# Patient Record
Sex: Male | Born: 1960
Health system: Southern US, Community
[De-identification: ages and names within clinical notes are randomized; demographics above are authoritative.]

## PROBLEM LIST (undated history)

## (undated) DIAGNOSIS — B029 Zoster without complications: Secondary | ICD-10-CM

## (undated) DIAGNOSIS — J45909 Unspecified asthma, uncomplicated: Secondary | ICD-10-CM

## (undated) HISTORY — PX: NO PAST SURGERIES: SHX2092

---

## 1998-11-23 ENCOUNTER — Ambulatory Visit (HOSPITAL_BASED_OUTPATIENT_CLINIC_OR_DEPARTMENT_OTHER): Admission: RE | Admit: 1998-11-23 | Discharge: 1998-11-23 | Payer: Self-pay | Admitting: Orthopedic Surgery

## 2002-11-17 ENCOUNTER — Encounter: Payer: Self-pay | Admitting: Emergency Medicine

## 2002-11-17 ENCOUNTER — Inpatient Hospital Stay (HOSPITAL_COMMUNITY): Admission: EM | Admit: 2002-11-17 | Discharge: 2002-11-18 | Payer: Self-pay | Admitting: Emergency Medicine

## 2012-03-03 ENCOUNTER — Other Ambulatory Visit: Payer: Self-pay | Admitting: Occupational Medicine

## 2012-03-03 ENCOUNTER — Ambulatory Visit: Payer: Self-pay

## 2012-03-03 DIAGNOSIS — Z0000 Encounter for general adult medical examination without abnormal findings: Secondary | ICD-10-CM

## 2018-11-09 ENCOUNTER — Encounter (HOSPITAL_COMMUNITY): Payer: Self-pay

## 2018-11-09 ENCOUNTER — Emergency Department (HOSPITAL_COMMUNITY)
Admission: EM | Admit: 2018-11-09 | Discharge: 2018-11-09 | Disposition: A | Discharge status: Home / Self Care | Payer: Self-pay | Attending: Emergency Medicine | Admitting: Emergency Medicine

## 2018-11-09 ENCOUNTER — Emergency Department (HOSPITAL_COMMUNITY): Payer: Self-pay

## 2018-11-09 DIAGNOSIS — F17210 Nicotine dependence, cigarettes, uncomplicated: Secondary | ICD-10-CM | POA: Insufficient documentation

## 2018-11-09 DIAGNOSIS — B029 Zoster without complications: Secondary | ICD-10-CM | POA: Insufficient documentation

## 2018-11-09 DIAGNOSIS — Z79899 Other long term (current) drug therapy: Secondary | ICD-10-CM | POA: Insufficient documentation

## 2018-11-09 DIAGNOSIS — M549 Dorsalgia, unspecified: Secondary | ICD-10-CM | POA: Insufficient documentation

## 2018-11-09 HISTORY — DX: Unspecified asthma, uncomplicated: J45.909

## 2018-11-09 MED ORDER — OXYCODONE-ACETAMINOPHEN 5-325 MG PO TABS: 1.0000 | ORAL_TABLET | ORAL | 0 refills | Status: DC | PRN

## 2018-11-09 MED ORDER — VALACYCLOVIR HCL 1 G PO TABS: 1000.0000 mg | ORAL_TABLET | Freq: Three times a day (TID) | ORAL | 0 refills | Status: AC

## 2018-11-09 NOTE — ED Notes (Signed)
Patient verbalizes understanding of discharge instructions. Opportunity for questioning and answers were provided. Armband removed by staff, pt discharged from ED ambulatory.   

## 2018-11-09 NOTE — Discharge Instructions (Signed)
You were seen in the emergency department today for back pain.  Your x-rays did not show any fractures or dislocations, there were some degenerative changes.  We suspect you have shingles.  We are sending you home with Valtrex, antiviral medicine, to treat this.  We also send you home with Percocet to treat pain.  -Percocet-this is a narcotic/controlled substance medication that has potential addicting qualities.  We recommend that you take 1-2 tablets every 6 hours as needed for severe pain.  Do not drive or operate heavy machinery when taking this medicine as it can be sedating. Do not drink alcohol or take other sedating medications when taking this medicine for safety reasons.  Keep this out of reach of small children.  Please be aware this medicine has Tylenol in it (325 mg/tab) do not exceed the maximum dose of Tylenol in a day per over the counter recommendations should you decide to supplement with Tylenol over the counter.   We have prescribed you new medication(s) today. Discuss the medications prescribed today with your pharmacist as they can have adverse effects and interactions with your other medicines including over the counter and prescribed medications. Seek medical evaluation if you start to experience new or abnormal symptoms after taking one of these medicines, seek care immediately if you start to experience difficulty breathing, feeling of your throat closing, facial swelling, or rash as these could be indications of a more serious allergic reaction  Please follow-up with primary care within 1 week for reevaluation.  Please be sure to keep the rash area covered when out in public and be sure to wash your hands plenty to prevent spread of infection.  May use calamine lotion or cool compresses to help with discomfort of the rash.  Return to the ER for new or worsening symptoms or any other concerns.

## 2018-11-09 NOTE — ED Triage Notes (Signed)
Pt endorses falling last week and hurting his back. Self medicated with naproxen on Thursday and on Friday developed a rash on this back and stomach. Denies shob or throat closure. Breath sounds clear. VSS

## 2018-11-09 NOTE — ED Provider Notes (Signed)
[facility] EMERGENCY DEPARTMENT Provider Note   CSN: [id number] Arrival date & time: 11/09/18  1322     History   Chief Complaint Chief Complaint  Patient presents with  . Allergic Reaction  . Rash    HPI Dylan Wood is a 57 y.o. male with history of tobacco abuse and asthma who presents to the emergency department with back pain status post mechanical fall 1 week prior as well as a rash that is painful and has been present for the past 4 days.  Patient states that he was ambulating tripped/slipped and fell landing on the back.  No head injury or loss of consciousness.  He states that since the fall he has been having pain to the diffuse lower and mid back.  States this is moderate to severe in nature, worse with movement, no significant alleviating factors.  He has been taking naproxen without relief.  He states that over the past 4 days he developed a painful/burning rash to the left side of his back that wraps around to the left side of the abdomen-states this is further irritated his initial back pain.  No history of similar rash.  He is taking naproxen previously without issues.  Denies dyspnea, facial swelling, difficulty swallowing, numbness, weakness, incontinence, saddle anesthesia, fever, chills, IVDU, or history of cancer.  No prior back surgeries.  HPI  Past Medical History:  Diagnosis Date  . Asthma     There are no active problems to display for this patient.   History reviewed. No pertinent surgical history.      Home Medications    Prior to Admission medications   Medication Sig Start Date End Date Taking? Authorizing Provider  acetaminophen (TYLENOL) 650 MG CR tablet Take 650-1,300 mg by mouth every 8 (eight) hours as needed for pain.   Yes [provider]  albuterol (VENTOLIN HFA) 108 (90 Base) MCG/ACT inhaler Inhale 2 puffs into the lungs every 6 (six) hours as needed for wheezing or shortness of breath.   Yes [provider]  naproxen sodium (ALEVE) 220 MG tablet Take 220-440 mg by mouth 2 (two) times daily as needed (for pain).   Yes [provider]    Family History History reviewed. No pertinent family history.  Social History Social History   Tobacco Use  . Smoking status: Current Every Day Smoker    Packs/day: 1.00    Types: Cigarettes  Substance Use Topics  . Alcohol use: Yes    Comment: occ  . Drug use: Never     Allergies   Naproxen   Review of Systems Review of Systems  Constitutional: Negative for chills, fever and unexpected weight change.  HENT: Negative for facial swelling, sore throat and trouble swallowing.   Respiratory: Negative for shortness of breath.   Cardiovascular: Negative for chest pain.  Gastrointestinal: Negative for abdominal pain, nausea and vomiting.  Genitourinary: Negative for dysuria.  Musculoskeletal: Positive for back pain.  Skin: Positive for rash.  Neurological: Negative for weakness and numbness.       Negative for saddle anesthesia or bowel/bladder incontinence.   All other systems reviewed and are negative.    Physical Exam Updated Vital Signs BP (!) 145/81 (BP Location: Right Arm)   Pulse 78   Temp 98 F (36.7 C) (Oral)   Resp 16   Ht 6\' 1"  (1.854 m)   Wt 81.6 kg   SpO2 100%   BMI 23.75 kg/m   Physical Exam Vitals  signs and nursing note reviewed.  Constitutional:      General: He is not in acute distress.    Appearance: He is well-developed. He is not toxic-appearing.  HENT:     Head: Normocephalic and atraumatic. No raccoon eyes or Battle's sign.  Eyes:     General:        Right eye: No discharge.        Left eye: No discharge.     Conjunctiva/sclera: Conjunctivae normal.  Neck:     Musculoskeletal: Normal range of motion and neck supple. No spinous process tenderness or muscular tenderness.  Cardiovascular:     Rate and Rhythm: Normal rate and regular rhythm.  Pulmonary:     Effort: Pulmonary effort  is normal. No respiratory distress.     Breath sounds: Normal breath sounds. No wheezing, rhonchi or rales.  Abdominal:     General: There is no distension.     Palpations: Abdomen is soft.     Tenderness: There is no guarding or rebound.  Musculoskeletal:     Comments: Patient has a vesicular rash with some lesions that are encrusted to the L flank/abdomen area in a dermatomal pattern- does not cross midline, tender to palpation. No significant warmth. No palpable fluctuance.  Extremities: Normal ROM. Nontender.  Back: Patient diffusely tender to thoracic/lumbar area including midline & bilateral paraspinal muscles L>R. No point/focal vertebral tenderness, no palpable step off or crepitus.   Skin:    General: Skin is warm and dry.     Findings: No rash.  Neurological:     Mental Status: He is alert.     Deep Tendon Reflexes:     Reflex Scores:      Patellar reflexes are 2+ on the right side and 2+ on the left side.    Comments: Clear speech.  Sensation grossly intact bilateral lower extremities.  5/5 strength with ankle plantar/dorsiflexion bilaterally.  Patellar DTRs are 2+ and symmetric.  Patient is ambulatory.  Psychiatric:        Behavior: Behavior normal.    ED Treatments / Results  Labs (all labs ordered are listed, but only abnormal results are displayed) Labs Reviewed - No data to display  EKG None  Radiology Dg Thoracic Spine 2 View  Result Date: 11/09/2018 CLINICAL DATA:  Larey SeatFell last week, pain in the mid back EXAM: THORACIC SPINE 2 VIEWS COMPARISON:  Chest x-ray of 03/03/2012 FINDINGS: The thoracic vertebrae are in normal alignment. No compression deformity is seen. Intervertebral disc spaces appear normal. No prominent paravertebral soft tissue is noted. There are degenerative changes present at C5-6 and C6-7 levels. IMPRESSION: 1. Normal alignment with no acute abnormality. 2. Degenerative change at C5-6 and C6-7 Electronically Signed   By: Dwyane DeePaul  Barry M.D.   On:  11/09/2018 15:50   Dg Lumbar Spine Complete  Result Date: 11/09/2018 CLINICAL DATA:  Fall last week, pain to mid back. Self medicated with naproxen on Thursday and on Friday developed a rash on this back and stomach EXAM: LUMBAR SPINE - COMPLETE 4+ VIEW COMPARISON:  None. FINDINGS: There is no evidence of lumbar spine fracture. Alignment is normal. Intervertebral disc spaces are maintained. Facet DJD L4-5, left worse than right. Aortic Atherosclerosis (ICD10-170.0). IMPRESSION: 1. Negative for fracture or other acute bone abnormality. 2. Facet DJD L4-5. Electronically Signed   By: Corlis Leak  Hassell M.D.   On: 11/09/2018 15:50    Procedures Procedures (including critical care time)  Medications Ordered in ED Medications - No data to  display   Initial Impression / Assessment and Plan / ED Course  I have reviewed the triage vital signs and the nursing notes.  Pertinent labs & imaging results that were available during my care of the patient were reviewed by me and considered in my medical decision making (see chart for details).   Patient presents to the ED with back pain s/p mechanical fall and painful rash to the L side. Patient nontoxic appearing, does not appear to have serious head/neck/back injury s/p fall. No CT head/cspine per [place] trauma rule. Thoracic/lumbar plain films without acute injury, degenerative changes noted. No point/focal midline tenderness or palpable step off. No neuro deficits. Suspect initial back pain was more so due to muscle soreness. Considered disc disease, UTI/pyelonephritis, kidney stone, aortic aneurysm/dissection, cauda equina or epidural abscess however these do not fit clinical picture at this time. Rash is tender with vesicles on erythematous base located along dermatomal pattern in the thoracic region unilaterally consistent with shingles. Does not appear to have CNS involvement. Does not appear to have superimposed bacterial infection. Will discharge home with  Valacyclovir for shingles & percocet for pain control, [place] Controlled Substance reporting System queried. PCP follow up. I discussed results, treatment plan, need for follow-up, and return precautions with the patient. Provided opportunity for questions, patient confirmed understanding and is in agreement with plan.   Final Clinical Impressions(s) / ED Diagnoses   Final diagnoses:  Acute bilateral back pain, unspecified back location  Herpes zoster without complication    ED Discharge Orders         Ordered    valACYclovir (VALTREX) 1000 MG tablet  3 times daily     11/09/18 1611    oxyCODONE-acetaminophen (PERCOCET/ROXICET) 5-325 MG tablet  Every 4 hours PRN     11/09/18 1611           Mirra Basilio, Pleas Koch, PA-C 11/09/18 1613    Gerhard Munch, MD 11/11/18 2354

## 2018-11-09 NOTE — ED Notes (Signed)
Patient transported to X-ray 

## 2018-11-12 ENCOUNTER — Encounter (HOSPITAL_COMMUNITY): Payer: Self-pay

## 2018-11-12 ENCOUNTER — Emergency Department (HOSPITAL_COMMUNITY)
Admission: EM | Admit: 2018-11-12 | Discharge: 2018-11-12 | Disposition: A | Discharge status: Home / Self Care | Payer: Self-pay | Attending: Emergency Medicine | Admitting: Emergency Medicine

## 2018-11-12 DIAGNOSIS — Z79899 Other long term (current) drug therapy: Secondary | ICD-10-CM | POA: Insufficient documentation

## 2018-11-12 DIAGNOSIS — B029 Zoster without complications: Secondary | ICD-10-CM | POA: Insufficient documentation

## 2018-11-12 DIAGNOSIS — J45909 Unspecified asthma, uncomplicated: Secondary | ICD-10-CM | POA: Insufficient documentation

## 2018-11-12 DIAGNOSIS — F17210 Nicotine dependence, cigarettes, uncomplicated: Secondary | ICD-10-CM | POA: Insufficient documentation

## 2018-11-12 DIAGNOSIS — M545 Low back pain, unspecified: Secondary | ICD-10-CM

## 2018-11-12 MED ORDER — KETOROLAC TROMETHAMINE 30 MG/ML IJ SOLN
30.0000 mg | Freq: Once | INTRAMUSCULAR | Status: AC
  Administered 2018-11-12: 30 mg via INTRAMUSCULAR
  Filled 2018-11-12: qty 1

## 2018-11-12 MED ORDER — HYDROCODONE-ACETAMINOPHEN 5-325 MG PO TABS: 1.0000 | ORAL_TABLET | ORAL | 0 refills | Status: DC | PRN

## 2018-11-12 MED ORDER — DOCUSATE SODIUM 250 MG PO CAPS: 250.0000 mg | ORAL_CAPSULE | Freq: Every day | ORAL | 0 refills | Status: DC | PRN

## 2018-11-12 MED ORDER — FAMOTIDINE 20 MG PO TABS
20.0000 mg | ORAL_TABLET | Freq: Once | ORAL | Status: AC
  Administered 2018-11-12: 20 mg via ORAL
  Filled 2018-11-12: qty 1

## 2018-11-12 MED ORDER — CYCLOBENZAPRINE HCL 10 MG PO TABS: 10.0000 mg | ORAL_TABLET | Freq: Every day | ORAL | 0 refills | Status: DC

## 2018-11-12 NOTE — ED Triage Notes (Signed)
Pt with c/o L sided flank pain; Pt states that he recently dx with shingles w rash to LL abd area spreading to back

## 2018-11-12 NOTE — ED Provider Notes (Signed)
[facility] EMERGENCY DEPARTMENT Provider Note   CSN: [id number] Arrival date & time: 11/12/18  0536     History   Chief Complaint Chief Complaint  Patient presents with  . Flank Pain    HPI Dylan Wood is a 57 y.o. male with a history of asthma, tobacco abuse presenting to the ED today with low back and left flank pain for 2 weeks after a mechanical fall. He was seen here 3 days ago with a rash that is a new shingles diagnosis. He was started on valtrex and given percocet for the pain. His plain films were negative.  Today he is reporting continued back pain on the left. It is alleviated with the percocet but relief does not last for more than 2 hours. He has not been able to sleep because of the pain. The pain is localized to left flank and does not radiate.   He also reports a feeling of nerve pain over the rash that remains unchanged from his last visit. He has been talking the valtrex as prescribed.   He admits to constipation for 1 week.  Denies fever, chills, IVDU, weight changes, numbness, weakness, incontinence, saddle anesthesia, dyspnea, and history of cancer.       Past Medical History:  Diagnosis Date  . Asthma     There are no active problems to display for this patient.   History reviewed. No pertinent surgical history.      Home Medications    Prior to Admission medications   Medication Sig Start Date End Date Taking? Authorizing Provider  acetaminophen (TYLENOL) 650 MG CR tablet Take 650-1,300 mg by mouth every 8 (eight) hours as needed for pain.   Yes [provider]  albuterol (VENTOLIN HFA) 108 (90 Base) MCG/ACT inhaler Inhale 2 puffs into the lungs every 6 (six) hours as needed for wheezing or shortness of breath.   Yes [provider]  naproxen sodium (ALEVE) 220 MG tablet Take 220-440 mg by mouth 2 (two) times daily as needed (for pain).   Yes [provider]  valACYclovir (VALTREX) 1000 MG  tablet Take 1 tablet (1,000 mg total) by mouth 3 (three) times daily for 7 days. 11/09/18 11/16/18 Yes Petrucelli, Samantha R, PA-C  cyclobenzaprine (FLEXERIL) 10 MG tablet Take 1 tablet (10 mg total) by mouth at bedtime. 11/12/18   Alessia Gonsalez E, PA-C  docusate sodium (COLACE) 250 MG capsule Take 1 capsule (250 mg total) by mouth daily as needed for constipation (while taking narcotic pain medication). 11/12/18   Torrin Crihfield E, PA-C  HYDROcodone-acetaminophen (NORCO/VICODIN) 5-325 MG tablet Take 1 tablet by mouth every 4 (four) hours as needed. 11/12/18   Kathreen Dileo, Caroleen Hamman, PA-C    Family History History reviewed. No pertinent family history.  Social History Social History   Tobacco Use  . Smoking status: Current Every Day Smoker    Packs/day: 1.00    Types: Cigarettes  Substance Use Topics  . Alcohol use: Yes    Comment: occ  . Drug use: Never     Allergies   Naproxen   Review of Systems Review of Systems  Constitutional: Negative for activity change, chills, fever and unexpected weight change.  Respiratory: Negative for shortness of breath.   Cardiovascular: Negative for chest pain and palpitations.  Gastrointestinal: Negative for nausea.  Genitourinary: Positive for flank pain. Negative for difficulty urinating and dysuria.  Musculoskeletal: Positive for back pain. Negative for gait problem.  Pain is over left paraspinal muscles  Skin: Positive for rash.  Neurological: Positive for weakness and numbness.  All other systems reviewed and are negative.    Physical Exam Updated Vital Signs BP (!) 168/137 (BP Location: Right Arm)   Pulse 75   Temp 97.8 F (36.6 C) (Oral)   Resp 20   SpO2 100%   Physical Exam Constitutional:      Appearance: Normal appearance.  HENT:     Head: Normocephalic and atraumatic.     Nose: Nose normal.     Mouth/Throat:     Mouth: Mucous membranes are moist.     Pharynx: Oropharynx is clear.  Eyes:      Extraocular Movements: Extraocular movements intact.     Pupils: Pupils are equal, round, and reactive to light.  Neck:     Musculoskeletal: Normal range of motion and neck supple.  Cardiovascular:     Rate and Rhythm: Normal rate and regular rhythm.     Heart sounds: No murmur.  Pulmonary:     Effort: Pulmonary effort is normal.     Breath sounds: Normal breath sounds.  Abdominal:     Palpations: Abdomen is soft.     Tenderness: There is no abdominal tenderness.  Musculoskeletal: Normal range of motion.        General: Tenderness present. No swelling.     Comments: Point tenderness over rash  Skin:    General: Skin is warm and dry.     Findings: Rash present.     Comments: Crusted vesicles over L paraspinal muscles in dermatomal pattern. Does not cross midline, tender to palpation. No significant warmth or fluctuance   Neurological:     General: No focal deficit present.     Mental Status: He is alert. Mental status is at baseline.     Cranial Nerves: No cranial nerve deficit.     Sensory: No sensory deficit.     Motor: No weakness.     Coordination: Coordination normal.     Gait: Gait normal.     Deep Tendon Reflexes: Reflexes normal.  Psychiatric:        Mood and Affect: Mood normal.        Behavior: Behavior normal.        Thought Content: Thought content normal.        Judgment: Judgment normal.      ED Treatments / Results  Labs (all labs ordered are listed, but only abnormal results are displayed) Labs Reviewed - No data to display  EKG None  Radiology No results found.  Procedures Procedures (including critical care time)  Medications Ordered in ED Medications  ketorolac (TORADOL) 30 MG/ML injection 30 mg (30 mg Intramuscular Given 11/12/18 0730)  famotidine (PEPCID) tablet 20 mg (20 mg Oral Given 11/12/18 0730)     Initial Impression / Assessment and Plan / ED Course  I have reviewed the triage vital signs and the nursing notes.  Pertinent labs &  imaging results that were available during my care of the patient were reviewed by me and considered in my medical decision making (see chart for details).      Patient presents to the ED with continued back and rash He initially presented to the ED 3 days ago for a rash on left flank and was found to have new shingles diagnosis. His previous workup included negative thoracic/lumbar plain films.  Today he is reporting continued pain over the rash and that he is unable to sleep  because of it. The rash has encrusted vesicles following the dermatomal pattern consistent with the shingles diagnosis. His pain is likely neuropathic from shingles because of the location and his description of burning sensation. He will continue the valtrex and discussed follow up with pcp.  Back pain treatment was also discussed and the use of anti-inflammatories is encouraged to help with his acute pain. His back pain does not have red flags such as weight loss, IVDU, incontinence, history of cancer, and fever. Will discharge home with limited Vicodin for pain control over the next few days and flexeril prn to help him rest.   Final Clinical Impressions(s) / ED Diagnoses   Final diagnoses:  Acute left-sided low back pain without sciatica  Herpes zoster without complication    ED Discharge Orders         Ordered    HYDROcodone-acetaminophen (NORCO/VICODIN) 5-325 MG tablet  Every 4 hours PRN     11/12/18 0742    docusate sodium (COLACE) 250 MG capsule  Daily PRN     11/12/18 0746    cyclobenzaprine (FLEXERIL) 10 MG tablet  Daily at bedtime     11/12/18 [id number]           Sherene Sireslbrizze, Wendelin Bradt E, PA-C 11/12/18 1229    Dione BoozeGlick, David, MD 11/13/18 0710

## 2018-11-12 NOTE — ED Notes (Signed)
Pt discharged from ED; instructions provided and scripts given; Pt encouraged to return to ED if symptoms worsen and to f/u with PCP; Pt verbalized understanding of all instructions 

## 2018-11-12 NOTE — Discharge Instructions (Addendum)
Follow up with PCP for shingles management.

## 2018-11-12 NOTE — ED Provider Notes (Signed)
7:11 AM Patient seen in conjunction with Albrizze PA-C. Pt with continued pain in back after fall last week and subsequent development of shingles rash.  Plain films were negative.  His pain is at the site of shingles on L back and flank.  Rash is classic appearance and pattern for shingles.   Will treat pain here with IM toradol. Home with additional pain meds and muscle relaxant to take at bedtime. Will also give PCP referrals.   BP (!) 168/137 (BP Location: Right Arm)   Pulse 75   Temp 97.8 F (36.6 C) (Oral)   Resp 20   SpO2 100%     Renne CriglerGeiple, Damoni Causby, PA-C 11/12/18 0848    Alvira MondaySchlossman, Erin, MD 11/12/18 [phone]

## 2018-11-14 ENCOUNTER — Encounter (HOSPITAL_COMMUNITY): Payer: Self-pay | Admitting: Emergency Medicine

## 2018-11-14 ENCOUNTER — Other Ambulatory Visit: Payer: Self-pay

## 2018-11-14 ENCOUNTER — Emergency Department (HOSPITAL_COMMUNITY)
Admission: EM | Admit: 2018-11-14 | Discharge: 2018-11-14 | Disposition: A | Discharge status: Home / Self Care | Payer: Self-pay | Attending: Emergency Medicine | Admitting: Emergency Medicine

## 2018-11-14 DIAGNOSIS — J45909 Unspecified asthma, uncomplicated: Secondary | ICD-10-CM | POA: Insufficient documentation

## 2018-11-14 DIAGNOSIS — F17210 Nicotine dependence, cigarettes, uncomplicated: Secondary | ICD-10-CM | POA: Insufficient documentation

## 2018-11-14 DIAGNOSIS — B028 Zoster with other complications: Secondary | ICD-10-CM

## 2018-11-14 DIAGNOSIS — B029 Zoster without complications: Secondary | ICD-10-CM | POA: Insufficient documentation

## 2018-11-14 MED ORDER — MORPHINE SULFATE 15 MG PO TABS: 15.0000 mg | ORAL_TABLET | ORAL | 0 refills | Status: DC | PRN

## 2018-11-14 MED ORDER — KETOROLAC TROMETHAMINE 60 MG/2ML IM SOLN
15.0000 mg | Freq: Once | INTRAMUSCULAR | Status: AC
  Administered 2018-11-14: 15 mg via INTRAMUSCULAR
  Filled 2018-11-14: qty 2

## 2018-11-14 MED ORDER — OXYCODONE HCL 5 MG PO TABS
10.0000 mg | ORAL_TABLET | Freq: Once | ORAL | Status: AC
  Administered 2018-11-14: 10 mg via ORAL
  Filled 2018-11-14: qty 2

## 2018-11-14 MED ORDER — DIAZEPAM 5 MG PO TABS
5.0000 mg | ORAL_TABLET | Freq: Once | ORAL | Status: AC
  Administered 2018-11-14: 5 mg via ORAL
  Filled 2018-11-14: qty 1

## 2018-11-14 MED ORDER — ACETAMINOPHEN 500 MG PO TABS
1000.0000 mg | ORAL_TABLET | Freq: Once | ORAL | Status: DC
  Filled 2018-11-14: qty 2

## 2018-11-14 MED ORDER — ONDANSETRON 4 MG PO TBDP: ORAL_TABLET | ORAL | 0 refills | Status: DC

## 2018-11-14 NOTE — ED Triage Notes (Signed)
Pt c/o pain related to shingles. Recent diagnosis this week, has been taking valtrex and hydrocodone, pt reports that he ran out of hydrocodone, received a new rx but hasn't gotten med filled yet.

## 2018-11-14 NOTE — ED Provider Notes (Signed)
[facility] EMERGENCY DEPARTMENT Provider Note   CSN: [id number] Arrival date & time: 11/14/18  0225     History   Chief Complaint Chief Complaint  Patient presents with  . Herpes Zoster    HPI Dylan Wood is a 57 y.o. male.  57 yo M with a chief complaint of left-sided thoracic back pain.  Patient has been seen here now 3 times for the same.  Diagnosed with shingles.  He felt that the pain is gotten somewhat worse over the past couple days.  Denies further distribution of the rash.  Denies fevers or chills.  The history is provided by the patient.  Illness  This is a new problem. The current episode started more than 1 week ago. The problem occurs constantly. The problem has not changed since onset.Pertinent negatives include no chest pain, no abdominal pain, no headaches and no shortness of breath. Nothing aggravates the symptoms. Nothing relieves the symptoms. He has tried nothing for the symptoms. The treatment provided no relief.    Past Medical History:  Diagnosis Date  . Asthma     There are no active problems to display for this patient.   History reviewed. No pertinent surgical history.      Home Medications    Prior to Admission medications   Medication Sig Start Date End Date Taking? Authorizing Provider  acetaminophen (TYLENOL) 650 MG CR tablet Take 650-1,300 mg by mouth every 8 (eight) hours as needed for pain.    [provider]  albuterol (VENTOLIN HFA) 108 (90 Base) MCG/ACT inhaler Inhale 2 puffs into the lungs every 6 (six) hours as needed for wheezing or shortness of breath.    [provider]  cyclobenzaprine (FLEXERIL) 10 MG tablet Take 1 tablet (10 mg total) by mouth at bedtime. 11/12/18   Albrizze, Kaitlyn E, PA-C  docusate sodium (COLACE) 250 MG capsule Take 1 capsule (250 mg total) by mouth daily as needed for constipation (while taking narcotic pain medication). 11/12/18   Albrizze, Kaitlyn E, PA-C    HYDROcodone-acetaminophen (NORCO/VICODIN) 5-325 MG tablet Take 1 tablet by mouth every 4 (four) hours as needed. 11/12/18   Albrizze, Kaitlyn E, PA-C  morphine (MSIR) 15 MG tablet Take 1 tablet (15 mg total) by mouth every 4 (four) hours as needed for severe pain. 11/14/18   Melene Plan, DO  naproxen sodium (ALEVE) 220 MG tablet Take 220-440 mg by mouth 2 (two) times daily as needed (for pain).    [provider]  ondansetron (ZOFRAN ODT) 4 MG disintegrating tablet 4mg  ODT q4 hours prn nausea/vomit 11/14/18   Melene Plan, DO  valACYclovir (VALTREX) 1000 MG tablet Take 1 tablet (1,000 mg total) by mouth 3 (three) times daily for 7 days. 11/09/18 11/16/18  Petrucelli, Pleas Koch, PA-C    Family History No family history on file.  Social History Social History   Tobacco Use  . Smoking status: Current Every Day Smoker    Packs/day: 1.00    Types: Cigarettes  . Smokeless tobacco: Never Used  Substance Use Topics  . Alcohol use: Yes    Comment: occ  . Drug use: Never     Allergies   Naproxen   Review of Systems Review of Systems  Constitutional: Negative for chills and fever.  HENT: Negative for congestion and facial swelling.   Eyes: Negative for discharge and visual disturbance.  Respiratory: Negative for shortness of breath.   Cardiovascular: Negative for chest pain and palpitations.  Gastrointestinal: Negative  for abdominal pain, diarrhea and vomiting.  Musculoskeletal: Positive for back pain. Negative for arthralgias and myalgias.  Skin: Positive for rash. Negative for color change.  Neurological: Negative for tremors, syncope and headaches.  Psychiatric/Behavioral: Negative for confusion and dysphoric mood.     Physical Exam Updated Vital Signs BP 117/79   Pulse 76   Temp 97.6 F (36.4 C) (Oral)   Resp 16   SpO2 100%   Physical Exam Vitals signs and nursing note reviewed.  Constitutional:      Appearance: He is well-developed.  HENT:     Head:  Normocephalic and atraumatic.  Eyes:     Pupils: Pupils are equal, round, and reactive to light.  Neck:     Musculoskeletal: Normal range of motion and neck supple.     Vascular: No JVD.  Cardiovascular:     Rate and Rhythm: Normal rate and regular rhythm.     Heart sounds: No murmur. No friction rub. No gallop.   Pulmonary:     Effort: No respiratory distress.     Breath sounds: No wheezing.  Abdominal:     General: There is no distension.     Tenderness: There is no guarding or rebound.  Musculoskeletal: Normal range of motion.     Comments: Vesicular rash to the left back that wraps around to his abdomen.  About ribs 5 through 8  Skin:    Coloration: Skin is not pale.     Findings: No rash.  Neurological:     Mental Status: He is alert and oriented to person, place, and time.  Psychiatric:        Behavior: Behavior normal.      ED Treatments / Results  Labs (all labs ordered are listed, but only abnormal results are displayed) Labs Reviewed - No data to display  EKG None  Radiology No results found.  Procedures Procedures (including critical care time)  Medications Ordered in ED Medications  acetaminophen (TYLENOL) tablet 1,000 mg (1,000 mg Oral Refused 11/14/18 0353)  oxyCODONE (Oxy IR/ROXICODONE) immediate release tablet 10 mg (10 mg Oral Given 11/14/18 0353)  ketorolac (TORADOL) injection 15 mg (15 mg Intramuscular Given 11/14/18 0353)  diazepam (VALIUM) tablet 5 mg (5 mg Oral Given 11/14/18 0353)     Initial Impression / Assessment and Plan / ED Course  I have reviewed the triage vital signs and the nursing notes.  Pertinent labs & imaging results that were available during my care of the patient were reviewed by me and considered in my medical decision making (see chart for details).     57 yo M with a chief complaint of left thoracic back pain.  Patient has been having difficulty sleeping due to the pain.  I will attempt to change his pain management  regimen.  He had finished his Percocets, I will start him on oral morphine we will have him do Tylenol and ibuprofen prior to narcotics suggested that this prolonged course of pain and requiring multiple visits to the ED he likely needs to follow-up with his family doctor for further pain management.  4:06 AM:  I have discussed the diagnosis/risks/treatment options with the patient and believe the pt to be eligible for discharge home to follow-up with PCP. We also discussed returning to the ED immediately if new or worsening sx occur. We discussed the sx which are most concerning (e.g., sudden worsening pain, fever, inability to tolerate by mouth) that necessitate immediate return. Medications administered to the patient during their  visit and any new prescriptions provided to the patient are listed below.  Medications given during this visit Medications  acetaminophen (TYLENOL) tablet 1,000 mg (1,000 mg Oral Refused 11/14/18 0353)  oxyCODONE (Oxy IR/ROXICODONE) immediate release tablet 10 mg (10 mg Oral Given 11/14/18 0353)  ketorolac (TORADOL) injection 15 mg (15 mg Intramuscular Given 11/14/18 0353)  diazepam (VALIUM) tablet 5 mg (5 mg Oral Given 11/14/18 0353)      The patient appears reasonably screen and/or stabilized for discharge and I doubt any other medical condition or other [facility] requiring further screening, evaluation, or treatment in the ED at this time prior to discharge.    Final Clinical Impressions(s) / ED Diagnoses   Final diagnoses:  Herpes zoster with complication    ED Discharge Orders         Ordered    morphine (MSIR) 15 MG tablet  Every 4 hours PRN     11/14/18 0351    ondansetron (ZOFRAN ODT) 4 MG disintegrating tablet     11/14/18 0351           Melene PlanFloyd, Janaisha Tolsma, DO 11/14/18 0406

## 2018-11-14 NOTE — ED Notes (Signed)
Pt verbalizes understanding of d/c instructions. Prescriptions reviewed with patient. Pt ambulatory at d/c with all belongings. Pt has a ride home.

## 2018-11-14 NOTE — Discharge Instructions (Signed)

## 2018-11-19 ENCOUNTER — Encounter (HOSPITAL_COMMUNITY): Payer: Self-pay | Admitting: Emergency Medicine

## 2018-11-19 ENCOUNTER — Other Ambulatory Visit: Payer: Self-pay

## 2018-11-19 ENCOUNTER — Emergency Department (HOSPITAL_COMMUNITY)
Admission: EM | Admit: 2018-11-19 | Discharge: 2018-11-19 | Disposition: A | Discharge status: Home / Self Care | Payer: Self-pay | Attending: Emergency Medicine | Admitting: Emergency Medicine

## 2018-11-19 DIAGNOSIS — J45909 Unspecified asthma, uncomplicated: Secondary | ICD-10-CM | POA: Insufficient documentation

## 2018-11-19 DIAGNOSIS — B028 Zoster with other complications: Secondary | ICD-10-CM | POA: Insufficient documentation

## 2018-11-19 DIAGNOSIS — F17210 Nicotine dependence, cigarettes, uncomplicated: Secondary | ICD-10-CM | POA: Insufficient documentation

## 2018-11-19 MED ORDER — GABAPENTIN 100 MG PO CAPS: 100.0000 mg | ORAL_CAPSULE | Freq: Three times a day (TID) | ORAL | 0 refills | Status: DC

## 2018-11-19 MED ORDER — DIAZEPAM 5 MG PO TABS
5.0000 mg | ORAL_TABLET | Freq: Once | ORAL | Status: DC
  Filled 2018-11-19: qty 1

## 2018-11-19 MED ORDER — KETOROLAC TROMETHAMINE 30 MG/ML IJ SOLN
15.0000 mg | Freq: Once | INTRAMUSCULAR | Status: AC
  Administered 2018-11-19: 15 mg via INTRAMUSCULAR
  Filled 2018-11-19: qty 1

## 2018-11-19 MED ORDER — MELOXICAM 7.5 MG PO TABS: 7.5000 mg | ORAL_TABLET | Freq: Every day | ORAL | 0 refills | Status: DC

## 2018-11-19 NOTE — ED Provider Notes (Signed)
[facility] Provider Note   CSN: [id number] Arrival date & time: 11/19/18  [id number]     History   Chief Complaint Chief Complaint  Patient presents with  . Breast Pain    everywhere    HPI Dylan Wood is a 57 y.o. male with history of asthma who presents with ongoing shingles pain.  Patient reports he has had symptoms for the past 3 weeks, but was not diagnosed until 11/09/2018.  Patient was started on Valtrex at that time.  He was also given Percocet.  Patient has been to the ED 3 times for this prior to today.  He has tried oral morphine and his pain continues.  He describes his pain as burning and tightening of his muscles.  He denies any fevers.  He reports his rash on his left flank is improving.  He denies any chest pain, shortness of breath, abdominal pain, nausea, vomiting.  HPI  Past Medical History:  Diagnosis Date  . Asthma     There are no active problems to display for this patient.   History reviewed. No pertinent surgical history.      Home Medications    Prior to Admission medications   Medication Sig Start Date End Date Taking? Authorizing Provider  acetaminophen (TYLENOL) 650 MG CR tablet Take 650-1,300 mg by mouth every 8 (eight) hours as needed for pain.    [provider]  albuterol (VENTOLIN HFA) 108 (90 Base) MCG/ACT inhaler Inhale 2 puffs into the lungs every 6 (six) hours as needed for wheezing or shortness of breath.    [provider]  cyclobenzaprine (FLEXERIL) 10 MG tablet Take 1 tablet (10 mg total) by mouth at bedtime. 11/12/18   Albrizze, Kaitlyn E, PA-C  docusate sodium (COLACE) 250 MG capsule Take 1 capsule (250 mg total) by mouth daily as needed for constipation (while taking narcotic pain medication). 11/12/18   Albrizze, Kaitlyn E, PA-C  gabapentin (NEURONTIN) 100 MG capsule Take 1 capsule (100 mg total) by mouth 3 (three) times daily. 11/19/18   Gianluca Chhim, Waylan BogaAlexandra M, PA-C    HYDROcodone-acetaminophen (NORCO/VICODIN) 5-325 MG tablet Take 1 tablet by mouth every 4 (four) hours as needed. 11/12/18   Albrizze, Kaitlyn E, PA-C  meloxicam (MOBIC) 7.5 MG tablet Take 1 tablet (7.5 mg total) by mouth daily. 11/19/18   Willford Rabideau, Waylan BogaAlexandra M, PA-C  morphine (MSIR) 15 MG tablet Take 1 tablet (15 mg total) by mouth every 4 (four) hours as needed for severe pain. 11/14/18   Melene PlanFloyd, Dan, DO  naproxen sodium (ALEVE) 220 MG tablet Take 220-440 mg by mouth 2 (two) times daily as needed (for pain).    [provider]  ondansetron (ZOFRAN ODT) 4 MG disintegrating tablet 4mg  ODT q4 hours prn nausea/vomit 11/14/18   Melene PlanFloyd, Dan, DO    Family History No family history on file.  Social History Social History   Tobacco Use  . Smoking status: Current Every Day Smoker    Packs/day: 1.00    Types: Cigarettes  . Smokeless tobacco: Never Used  Substance Use Topics  . Alcohol use: Yes    Comment: occ  . Drug use: Never     Allergies   Naproxen   Review of Systems Review of Systems  Constitutional: Negative for fever.  Musculoskeletal: Positive for back pain.  Skin: Positive for rash.     Physical Exam Updated Vital Signs BP (!) 165/88 (BP Location: Right Arm)   Pulse 73   Temp  97.7 F (36.5 C) (Oral)   Resp 18   SpO2 97%   Physical Exam Vitals signs and nursing note reviewed.  Constitutional:      General: He is not in acute distress.    Appearance: He is well-developed. He is not diaphoretic.  HENT:     Head: Normocephalic and atraumatic.     Mouth/Throat:     Pharynx: No oropharyngeal exudate.  Eyes:     General: No scleral icterus.       Right eye: No discharge.        Left eye: No discharge.     Conjunctiva/sclera: Conjunctivae normal.     Pupils: Pupils are equal, round, and reactive to light.  Neck:     Musculoskeletal: Normal range of motion and neck supple.     Thyroid: No thyromegaly.  Cardiovascular:     Rate and Rhythm: Normal rate and  regular rhythm.     Heart sounds: Normal heart sounds. No murmur. No friction rub. No gallop.   Pulmonary:     Effort: Pulmonary effort is normal. No respiratory distress.     Breath sounds: Normal breath sounds. No stridor. No wheezing or rales.  Chest:    Abdominal:     General: Bowel sounds are normal. There is no distension.     Palpations: Abdomen is soft.     Tenderness: There is no abdominal tenderness. There is no guarding or rebound.  Musculoskeletal:       Back:  Lymphadenopathy:     Cervical: No cervical adenopathy.  Skin:    General: Skin is warm and dry.     Coloration: Skin is not pale.     Findings: No rash.  Neurological:     Mental Status: He is alert.     Coordination: Coordination normal.      ED Treatments / Results  Labs (all labs ordered are listed, but only abnormal results are displayed) Labs Reviewed - No data to display  EKG None  Radiology No results found.  Procedures Procedures (including critical care time)  Medications Ordered in ED Medications  ketorolac (TORADOL) 30 MG/ML injection 15 mg (15 mg Intramuscular Given 11/19/18 0747)     Initial Impression / Assessment and Plan / ED Course  I have reviewed the triage vital signs and the nursing notes.  Pertinent labs & imaging results that were available during my care of the patient were reviewed by me and considered in my medical decision making (see chart for details).     Patient presenting with ongoing burning pain from herpes zoster.  The rash appears to be improving.  There are no signs of secondary infection.  Patient having difficulty controlling pain.  Patient reports he did have some relief from the Toradol he received at his last visit.  Patient given Toradol in the ED.  Will start Mobic and Neurontin.  Neurontin taper discussed with the patient.  Patient strongly encouraged to follow-up and establish care with a PCP and neurology.  Return precautions discussed.  Patient  understands and agrees with plan.  Patient vitals stable throughout ED course and discharged in satisfactory condition. I discussed patient case with Dr. Clayborne Dana who guided the patient's management and agrees with plan.   Final Clinical Impressions(s) / ED Diagnoses   Final diagnoses:  Herpes zoster with complication    ED Discharge Orders         Ordered    meloxicam (MOBIC) 7.5 MG tablet  Daily  11/19/18 0755    gabapentin (NEURONTIN) 100 MG capsule  3 times daily     11/19/18 0755           Emi HolesLaw, Yeudiel Mateo M, PA-C 11/19/18 [id number]    Marily MemosMesner, Jason, MD 11/19/18 [phone]

## 2018-11-19 NOTE — ED Triage Notes (Signed)
Pt. Stated, Dylan Wood had shingles since the 17th and this is my 4th visit and the pain is so bad, I can't stand it.

## 2018-11-19 NOTE — Discharge Instructions (Signed)
Take Mobic once daily for your pain.  Take Neurontin 3 times daily as prescribed.  If 100 mg 3 times a day is not helping after 4 to 5 days, you can increase to 200 mg 3 times a day.  If 200 mg 3 times a day is not helping after 4 to 5 days, you can increase to 300 mg 3 times a day.  It is important that you taper like this and do not immediately start taking 200-300 mg 3 times a day.  Please follow-up and establish care with a primary care provider by calling the number circled on your discharge paperwork.  Please also follow-up with neurology for further evaluation and treatment.  Please return the emergency department if you develop any new or worsening symptoms.

## 2019-01-09 ENCOUNTER — Emergency Department (HOSPITAL_COMMUNITY)
Admission: EM | Admit: 2019-01-09 | Discharge: 2019-01-09 | Disposition: A | Discharge status: Home / Self Care | Payer: Self-pay | Attending: Emergency Medicine | Admitting: Emergency Medicine

## 2019-01-09 ENCOUNTER — Encounter (HOSPITAL_COMMUNITY): Payer: Self-pay | Admitting: Emergency Medicine

## 2019-01-09 ENCOUNTER — Other Ambulatory Visit: Payer: Self-pay

## 2019-01-09 DIAGNOSIS — J45909 Unspecified asthma, uncomplicated: Secondary | ICD-10-CM | POA: Insufficient documentation

## 2019-01-09 DIAGNOSIS — Z79899 Other long term (current) drug therapy: Secondary | ICD-10-CM | POA: Insufficient documentation

## 2019-01-09 DIAGNOSIS — F17210 Nicotine dependence, cigarettes, uncomplicated: Secondary | ICD-10-CM | POA: Insufficient documentation

## 2019-01-09 DIAGNOSIS — B029 Zoster without complications: Secondary | ICD-10-CM | POA: Insufficient documentation

## 2019-01-09 MED ORDER — VALACYCLOVIR HCL 1 G PO TABS: 1000.0000 mg | ORAL_TABLET | Freq: Three times a day (TID) | ORAL | 0 refills | Status: AC

## 2019-01-09 MED ORDER — GABAPENTIN 300 MG PO CAPS: 300.0000 mg | ORAL_CAPSULE | Freq: Three times a day (TID) | ORAL | 0 refills | Status: DC

## 2019-01-09 MED ORDER — PREDNISONE 10 MG PO TABS: 20.0000 mg | ORAL_TABLET | Freq: Every day | ORAL | 0 refills | Status: AC

## 2019-01-09 NOTE — Discharge Instructions (Signed)
Follow-up with primary care doctor.  Return to ED if symptoms worsen.

## 2019-01-09 NOTE — ED Provider Notes (Signed)
[facility] EMERGENCY DEPARTMENT Provider Note   CSN: [id number] Arrival date & time: 01/09/19  1143     History   Chief Complaint Chief Complaint  Patient presents with  . Herpes Zoster    HPI Dylan Wood is a 58 y.o. male.  The history is provided by the patient.  Rash  Location:  Torso Torso rash location:  L flank Quality: painful   Pain details:    Quality:  Aching   Severity:  Mild   Onset quality:  Gradual   Timing:  Constant Onset quality:  Gradual Timing:  Constant Chronicity:  Recurrent Context comment:  Hx of zoster in same area Relieved by:  Nothing Worsened by:  Nothing Associated symptoms: no abdominal pain, no fever, no joint pain, no shortness of breath, no sore throat and not vomiting     Past Medical History:  Diagnosis Date  . Asthma     There are no active problems to display for this patient.   History reviewed. No pertinent surgical history.      Home Medications    Prior to Admission medications   Medication Sig Start Date End Date Taking? Authorizing Provider  acetaminophen (TYLENOL) 650 MG CR tablet Take 650-1,300 mg by mouth every 8 (eight) hours as needed for pain.    [provider]  albuterol (VENTOLIN HFA) 108 (90 Base) MCG/ACT inhaler Inhale 2 puffs into the lungs every 6 (six) hours as needed for wheezing or shortness of breath.    [provider]  cyclobenzaprine (FLEXERIL) 10 MG tablet Take 1 tablet (10 mg total) by mouth at bedtime. 11/12/18   Albrizze, Kaitlyn E, PA-C  docusate sodium (COLACE) 250 MG capsule Take 1 capsule (250 mg total) by mouth daily as needed for constipation (while taking narcotic pain medication). 11/12/18   Albrizze, Kaitlyn E, PA-C  gabapentin (NEURONTIN) 300 MG capsule Take 1 capsule (300 mg total) by mouth 3 (three) times daily for 20 days. 01/09/19 01/29/19  Shantese Raven, DO  HYDROcodone-acetaminophen (NORCO/VICODIN) 5-325 MG tablet Take 1 tablet by mouth  every 4 (four) hours as needed. 11/12/18   Albrizze, Kaitlyn E, PA-C  meloxicam (MOBIC) 7.5 MG tablet Take 1 tablet (7.5 mg total) by mouth daily. 11/19/18   Law, Waylan Boga, PA-C  morphine (MSIR) 15 MG tablet Take 1 tablet (15 mg total) by mouth every 4 (four) hours as needed for severe pain. 11/14/18   Melene Plan, DO  naproxen sodium (ALEVE) 220 MG tablet Take 220-440 mg by mouth 2 (two) times daily as needed (for pain).    [provider]  ondansetron (ZOFRAN ODT) 4 MG disintegrating tablet 4mg  ODT q4 hours prn nausea/vomit 11/14/18   Melene Plan, DO  predniSONE (DELTASONE) 10 MG tablet Take 2 tablets (20 mg total) by mouth daily for 4 days. 01/09/19 01/13/19  Ellington Greenslade, DO  valACYclovir (VALTREX) 1000 MG tablet Take 1 tablet (1,000 mg total) by mouth 3 (three) times daily for 7 days. 01/09/19 01/16/19  Virgina Norfolk, DO    Family History History reviewed. No pertinent family history.  Social History Social History   Tobacco Use  . Smoking status: Current Every Day Smoker    Packs/day: 1.00    Types: Cigarettes  . Smokeless tobacco: Never Used  Substance Use Topics  . Alcohol use: Yes    Comment: occ  . Drug use: Never     Allergies   Naproxen   Review of Systems Review of Systems  Constitutional:  Negative for chills and fever.  HENT: Negative for ear pain and sore throat.   Eyes: Negative for pain and visual disturbance.  Respiratory: Negative for cough and shortness of breath.   Cardiovascular: Negative for chest pain and palpitations.  Gastrointestinal: Negative for abdominal pain and vomiting.  Genitourinary: Negative for dysuria and hematuria.  Musculoskeletal: Negative for arthralgias and back pain.  Skin: Positive for rash. Negative for color change.  Neurological: Negative for seizures and syncope.  All other systems reviewed and are negative.    Physical Exam Updated Vital Signs  ED Triage Vitals  Enc Vitals Group     BP 01/09/19 1152 128/74       Pulse Rate 01/09/19 1150 71     Resp 01/09/19 1150 16     Temp 01/09/19 1150 (!) 97.5 F (36.4 C)     Temp Source 01/09/19 1150 Oral     SpO2 01/09/19 1150 99 %     Weight 01/09/19 1150 180 lb (81.6 kg)     Height 01/09/19 1150 6\' 1"  (1.854 m)     Head Circumference --      Peak Flow --      Pain Score 01/09/19 1150 10     Pain Loc --      Pain Edu? --      Excl. in GC? --     Physical Exam Vitals signs and nursing note reviewed.  Constitutional:      Appearance: He is well-developed.  HENT:     Head: Normocephalic and atraumatic.  Eyes:     Conjunctiva/sclera: Conjunctivae normal.  Neck:     Musculoskeletal: Neck supple.  Cardiovascular:     Rate and Rhythm: Normal rate and regular rhythm.     Pulses: Normal pulses.     Heart sounds: Normal heart sounds. No murmur.  Pulmonary:     Effort: Pulmonary effort is normal. No respiratory distress.     Breath sounds: Normal breath sounds.  Abdominal:     Palpations: Abdomen is soft.     Tenderness: There is no abdominal tenderness.  Skin:    General: Skin is warm and dry.     Findings: Rash (left side of torso with mild rash along lower anterior and posterior ribs) present.  Neurological:     Mental Status: He is alert.      ED Treatments / Results  Labs (all labs ordered are listed, but only abnormal results are displayed) Labs Reviewed - No data to display  EKG None  Radiology No results found.  Procedures Procedures (including critical care time)  Medications Ordered in ED Medications - No data to display   Initial Impression / Assessment and Plan / ED Course  I have reviewed the triage vital signs and the nursing notes.  Pertinent labs & imaging results that were available during my care of the patient were reviewed by me and considered in my medical decision making (see chart for details).     Dylan Wood is a 58 year old male with history of asthma, recent episode of shingles who presents  to the ED with concern for reactivation.  Patient with normal vitals.  No fever.  Patient has struggled with postherpetic neuralgia but states that pain is increased over the last 2 to 3 days and now he starts to see a fine rash in the similar dermatome as his prior zoster.  He appears to have a fine redness and rash along the left middle of the torso that  appears to wrap around the anterior and posterior ribs likely an early zoster infection.  He states that pain has increased as well and suspect that this may progress therefore will treat with antiviral, prednisone, gabapentin.  Recommend follow-up with primary care doctor and discharged from ED in good condition.  Denies any other symptoms.  No chest pain, no shortness of breath.  This chart was dictated using voice recognition software.  Despite best efforts to proofread,  errors can occur which can change the documentation meaning.    Final Clinical Impressions(s) / ED Diagnoses   Final diagnoses:  Herpes zoster without complication    ED Discharge Orders         Ordered    gabapentin (NEURONTIN) 300 MG capsule  3 times daily     01/09/19 1227    predniSONE (DELTASONE) 10 MG tablet  Daily     01/09/19 1227    valACYclovir (VALTREX) 1000 MG tablet  3 times daily     01/09/19 1227           Edmondsonuratolo, Maryfer Tauzin, DO 01/09/19 1231

## 2019-01-09 NOTE — ED Triage Notes (Signed)
Pt arrives POV. Complaining of shingles for approximately 2 months. Rash to left ribcage. No open blisters. In past placed on gabapentin.

## 2019-01-20 ENCOUNTER — Inpatient Hospital Stay: Payer: Self-pay | Admitting: Critical Care Medicine

## 2019-01-20 NOTE — Progress Notes (Deleted)
   Subjective:    Patient ID: Dylan Wood, male    DOB: 28-Jul-1961, 58 y.o.   MRN: [id number]  Dylan Wood is a 58 year old male with history of asthma, recent episode of shingles who presents to the ED with concern for reactivation.  Patient with normal vitals.  No fever.  Patient has struggled with postherpetic neuralgia but states that pain is increased over the last 2 to 3 days and now he starts to see a fine rash in the similar dermatome as his prior zoster.  He appears to have a fine redness and rash along the left middle of the torso that appears to wrap around the anterior and posterior ribs likely an early zoster infection.  He states that pain has increased as well and suspect that this may progress therefore will treat with antiviral, prednisone, gabapentin.  Recommend follow-up with primary care doctor and discharged from ED in good condition.  Denies any other symptoms.  No chest pain, no shortness of breath.   Post ED  Mult visits for H Zoster rx gaba/antiviral/pred  Here for f/u   No PCP     Review of Systems     Objective:   Physical Exam        Assessment & Plan:

## 2019-02-01 ENCOUNTER — Other Ambulatory Visit: Payer: Self-pay

## 2019-02-01 ENCOUNTER — Encounter (HOSPITAL_COMMUNITY): Payer: Self-pay | Admitting: Emergency Medicine

## 2019-02-01 ENCOUNTER — Emergency Department (HOSPITAL_COMMUNITY)
Admission: EM | Admit: 2019-02-01 | Discharge: 2019-02-01 | Disposition: A | Discharge status: Home / Self Care | Payer: Self-pay | Attending: Emergency Medicine | Admitting: Emergency Medicine

## 2019-02-01 DIAGNOSIS — F17210 Nicotine dependence, cigarettes, uncomplicated: Secondary | ICD-10-CM | POA: Insufficient documentation

## 2019-02-01 DIAGNOSIS — Z760 Encounter for issue of repeat prescription: Secondary | ICD-10-CM | POA: Insufficient documentation

## 2019-02-01 DIAGNOSIS — B029 Zoster without complications: Secondary | ICD-10-CM | POA: Insufficient documentation

## 2019-02-01 DIAGNOSIS — J45909 Unspecified asthma, uncomplicated: Secondary | ICD-10-CM | POA: Insufficient documentation

## 2019-02-01 HISTORY — DX: Zoster without complications: B02.9

## 2019-02-01 MED ORDER — GABAPENTIN 300 MG PO CAPS: 600.0000 mg | ORAL_CAPSULE | Freq: Three times a day (TID) | ORAL | 0 refills | Status: DC

## 2019-02-01 MED ORDER — PREDNISONE 20 MG PO TABS: ORAL_TABLET | ORAL | 0 refills | Status: DC

## 2019-02-01 MED ORDER — PREDNISONE 20 MG PO TABS
60.0000 mg | ORAL_TABLET | Freq: Once | ORAL | Status: AC
  Administered 2019-02-01: 60 mg via ORAL
  Filled 2019-02-01: qty 3

## 2019-02-01 MED ORDER — VALACYCLOVIR HCL 1 G PO TABS: 1000.0000 mg | ORAL_TABLET | Freq: Three times a day (TID) | ORAL | 0 refills | Status: DC

## 2019-02-01 NOTE — ED Triage Notes (Signed)
Pt reports being treated for shingles approximately 1 month ago, was given prednisone, antiviral, and gabapentin. Pt reports he started noticing the rash coming back for about 1 week. Pt has rash to L back radiating to ribcage, no open blisters. Pt reports worsening pain to area over the last few days.

## 2019-02-01 NOTE — ED Notes (Signed)
ED Provider at bedside. 

## 2019-02-01 NOTE — ED Provider Notes (Signed)
[facility] EMERGENCY DEPARTMENT Provider Note   CSN: [id number] Arrival date & time: 02/01/19  1613    History   Chief Complaint Chief Complaint  Patient presents with  . Herpes Zoster  . Medication Refill    HPI Dylan Wood is a 58 y.o. male.     58 yo M here with concern for recurrent shingles.  Patient states that he had a recurrence of the shingles and came here and was started on different medications for it.  Once he stopped the gabapentin he felt the symptoms came back.  Feels that the rash is returned.  He denies fevers or chills denies any other areas of rash.  Denies any ointments or lotions or topical pads applied to the area.  He denies immunosuppressive disease, HIV, transplant, chemotherapy.  The history is provided by the patient.  Medication Refill    Past Medical History:  Diagnosis Date  . Asthma   . Shingles     There are no active problems to display for this patient.   History reviewed. No pertinent surgical history.      Home Medications    Prior to Admission medications   Medication Sig Start Date End Date Taking? Authorizing Provider  acetaminophen (TYLENOL) 650 MG CR tablet Take 650-1,300 mg by mouth every 8 (eight) hours as needed for pain.    [provider]  albuterol (VENTOLIN HFA) 108 (90 Base) MCG/ACT inhaler Inhale 2 puffs into the lungs every 6 (six) hours as needed for wheezing or shortness of breath.    [provider]  cyclobenzaprine (FLEXERIL) 10 MG tablet Take 1 tablet (10 mg total) by mouth at bedtime. 11/12/18   Albrizze, Kaitlyn E, PA-C  docusate sodium (COLACE) 250 MG capsule Take 1 capsule (250 mg total) by mouth daily as needed for constipation (while taking narcotic pain medication). 11/12/18   Albrizze, Kaitlyn E, PA-C  gabapentin (NEURONTIN) 300 MG capsule Take 2 capsules (600 mg total) by mouth 3 (three) times daily for 30 days. 02/01/19 03/03/19  Melene Plan, DO    HYDROcodone-acetaminophen (NORCO/VICODIN) 5-325 MG tablet Take 1 tablet by mouth every 4 (four) hours as needed. 11/12/18   Albrizze, Kaitlyn E, PA-C  meloxicam (MOBIC) 7.5 MG tablet Take 1 tablet (7.5 mg total) by mouth daily. 11/19/18   Law, Waylan Boga, PA-C  morphine (MSIR) 15 MG tablet Take 1 tablet (15 mg total) by mouth every 4 (four) hours as needed for severe pain. 11/14/18   Melene Plan, DO  naproxen sodium (ALEVE) 220 MG tablet Take 220-440 mg by mouth 2 (two) times daily as needed (for pain).    [provider]  ondansetron (ZOFRAN ODT) 4 MG disintegrating tablet 4mg  ODT q4 hours prn nausea/vomit 11/14/18   Melene Plan, DO  predniSONE (DELTASONE) 20 MG tablet 2 tabs po daily x 4 days 02/01/19   Melene Plan, DO  valACYclovir (VALTREX) 1000 MG tablet Take 1 tablet (1,000 mg total) by mouth 3 (three) times daily. 02/01/19   Melene Plan, DO    Family History No family history on file.  Social History Social History   Tobacco Use  . Smoking status: Current Every Day Smoker    Packs/day: 1.00    Types: Cigarettes  . Smokeless tobacco: Never Used  Substance Use Topics  . Alcohol use: Yes    Comment: occ  . Drug use: Never     Allergies   Patient has no known allergies.   Review of Systems  Review of Systems  Constitutional: Negative for chills and fever.  HENT: Negative for congestion and facial swelling.   Eyes: Negative for discharge and visual disturbance.  Respiratory: Negative for shortness of breath.   Cardiovascular: Negative for chest pain and palpitations.  Gastrointestinal: Negative for abdominal pain, diarrhea and vomiting.  Musculoskeletal: Positive for back pain. Negative for arthralgias and myalgias.  Skin: Negative for color change and rash.  Neurological: Negative for tremors, syncope and headaches.  Psychiatric/Behavioral: Negative for confusion and dysphoric mood.     Physical Exam Updated Vital Signs BP 125/72 (BP Location: Left Arm)    Pulse 63   Temp (!) 97.5 F (36.4 C) (Oral)   Resp 16   SpO2 100%   Physical Exam Vitals signs and nursing note reviewed.  Constitutional:      Appearance: He is well-developed.  HENT:     Head: Normocephalic and atraumatic.  Eyes:     Pupils: Pupils are equal, round, and reactive to light.  Neck:     Musculoskeletal: Normal range of motion and neck supple.     Vascular: No JVD.  Cardiovascular:     Rate and Rhythm: Normal rate and regular rhythm.     Heart sounds: No murmur. No friction rub. No gallop.   Pulmonary:     Effort: No respiratory distress.     Breath sounds: No wheezing.  Abdominal:     General: There is no distension.     Tenderness: There is no guarding or rebound.  Musculoskeletal: Normal range of motion.  Skin:    Coloration: Skin is not pale.     Findings: No rash.       Neurological:     Mental Status: He is alert and oriented to person, place, and time.  Psychiatric:        Behavior: Behavior normal.      ED Treatments / Results  Labs (all labs ordered are listed, but only abnormal results are displayed) Labs Reviewed - No data to display  EKG None  Radiology No results found.  Procedures Procedures (including critical care time)  Medications Ordered in ED Medications  predniSONE (DELTASONE) tablet 60 mg (has no administration in time range)     Initial Impression / Assessment and Plan / ED Course  I have reviewed the triage vital signs and the nursing notes.  Pertinent labs & imaging results that were available during my care of the patient were reviewed by me and considered in my medical decision making (see chart for details).        58 yo M with a chief complaint of left-sided back pain.  Feels like the last time he had shingles.  Started immediately upon stopping his gabapentin.  The area of his pain is a little bit lower than where he had had his prior shingles rash.  The area is erythematous but no obvious vesicles.  I  will treat this may be a recurrent shingles outbreak though this would be the patient's third 1 in the past 6 months.  He denies any prior issues with immunosuppression.  He has follow-up with the family doctor for the first time next week.  He will discuss this issue with them.  4:42 PM:  I have discussed the diagnosis/risks/treatment options with the patient and believe the pt to be eligible for discharge home to follow-up with PCP. We also discussed returning to the ED immediately if new or worsening sx occur. We discussed the sx which are most  concerning (e.g., sudden worsening pain, fever, inability to tolerate by mouth) that necessitate immediate return. Medications administered to the patient during their visit and any new prescriptions provided to the patient are listed below.  Medications given during this visit Medications  predniSONE (DELTASONE) tablet 60 mg (has no administration in time range)     The patient appears reasonably screen and/or stabilized for discharge and I doubt any other medical condition or other [facility] requiring further screening, evaluation, or treatment in the ED at this time prior to discharge.    Final Clinical Impressions(s) / ED Diagnoses   Final diagnoses:  Herpes zoster without complication    ED Discharge Orders         Ordered    gabapentin (NEURONTIN) 300 MG capsule  3 times daily     02/01/19 1635    valACYclovir (VALTREX) 1000 MG tablet  3 times daily     02/01/19 1635    predniSONE (DELTASONE) 20 MG tablet  Status:  Discontinued     02/01/19 1635    predniSONE (DELTASONE) 20 MG tablet     02/01/19 1636           Melene Plan, DO 02/01/19 1642

## 2019-02-01 NOTE — Discharge Instructions (Signed)
Please follow-up with your doctor when you can.  They may need to do further work-up on you for recurrent episodes of shingles, and this may indicate that you have an underlying immunosuppressive issue.  They may also want to have the diagnosis solidified by having a skin biopsy or by seeing infectious disease.

## 2019-02-09 NOTE — Progress Notes (Signed)
Patient ID: SIG CLISHAM, male   DOB: February 11, 1961, 58 y.o.   MRN: [id number]      Dylan Wood, is a 58 y.o. male  BWI:[id number]  ULA:[id number]  DOB - 27-Oct-1961  Subjective:  Chief Complaint and HPI: Dylan Wood is a 58 y.o. male here today to establish care and for a follow up visit After being seen in the ED 02/01/2019 for what the patient believes to be "recurrent shingles" for the 3rd time in a year.  He was requesting gabapentin. He believes he needs to be on Valtrex all the time and also gabapentin all the time for post-herpetic neuralgia.  No rash currently.  H/O asthma.  Was on symbicort previously and well-controlled.  Currently not having to use albuterol very often.  He would like to have symbicort and RF albuterol.     ROS:   Constitutional:  No f/c, No night sweats, No unexplained weight loss. EENT:  No vision changes, No blurry vision, No hearing changes. No mouth, throat, or ear problems.  Respiratory: No cough, No SOB Cardiac: No CP, no palpitations GI:  No abd pain, No N/V/D. GU: No Urinary s/sx Musculoskeletal: No joint pain Neuro: No headache, no dizziness, no motor weakness.  Skin: No rash currently Endocrine:  No polydipsia. No polyuria.  Psych: Denies SI/HI  No problems updated.  ALLERGIES: No Known Allergies  PAST MEDICAL HISTORY: Past Medical History:  Diagnosis Date  . Asthma   . Shingles     MEDICATIONS AT HOME: Prior to Admission medications   Medication Sig Start Date End Date Taking? Authorizing Provider  albuterol (VENTOLIN HFA) 108 (90 Base) MCG/ACT inhaler Inhale 2 puffs into the lungs every 6 (six) hours as needed for wheezing or shortness of breath. 02/10/19  Yes Anders Simmonds, PA-C  gabapentin (NEURONTIN) 300 MG capsule Take 2 capsules (600 mg total) by mouth 3 (three) times daily for 30 days. 02/10/19 03/12/19 Yes , Marzella Schlein, PA-C  naproxen sodium (ALEVE) 220 MG tablet Take 220-440 mg by mouth 2 (two) times daily as  needed (for pain).   Yes [provider]  ondansetron (ZOFRAN ODT) 4 MG disintegrating tablet 4mg  ODT q4 hours prn nausea/vomit 11/14/18  Yes Melene Plan, DO  valACYclovir (VALTREX) 1000 MG tablet Take 1 tablet (1,000 mg total) by mouth 3 (three) times daily. 02/10/19  Yes Anders Simmonds, PA-C  acetaminophen (TYLENOL) 650 MG CR tablet Take 650-1,300 mg by mouth every 8 (eight) hours as needed for pain.    [provider]  budesonide-formoterol (SYMBICORT) 80-4.5 MCG/ACT inhaler Inhale 2 puffs into the lungs 2 (two) times daily. 02/10/19   Anders Simmonds, PA-C  cyclobenzaprine (FLEXERIL) 10 MG tablet Take 1 tablet (10 mg total) by mouth at bedtime. Patient not taking: Reported on 02/10/2019 11/12/18   Albrizze, Yvonna Alanis E, PA-C  docusate sodium (COLACE) 250 MG capsule Take 1 capsule (250 mg total) by mouth daily as needed for constipation (while taking narcotic pain medication). Patient not taking: Reported on 02/10/2019 11/12/18   Albrizze, Caroleen Hamman, PA-C  meloxicam (MOBIC) 7.5 MG tablet Take 1 tablet (7.5 mg total) by mouth daily. Patient not taking: Reported on 02/10/2019 11/19/18   Emi Holes, PA-C     Objective:  EXAM:   Vitals:   02/10/19 1425  BP: 131/79  Pulse: 65  Temp: 98.6 F (37 C)  TempSrc: Oral  SpO2: 95%  Weight: 191 lb 6.4 oz (86.8 kg)  Height: 6\' 1"  (1.854 m)  General appearance : A&OX3. NAD. Non-toxic-appearing HEENT: Atraumatic and Normocephalic.  PERRLA. EOM intact.  Neck: supple, no JVD. No cervical lymphadenopathy. No thyromegaly Chest/Lungs:  Breathing-non-labored, Good air entry bilaterally, breath sounds normal without rales, rhonchi, or wheezing  CVS: S1 S2 regular, no murmurs, gallops, rubs  Extremities: Bilateral Lower Ext shows no edema, both legs are warm to touch with = pulse throughout Neurology:  CN II-XII grossly intact, Non focal.  Psych:  TP linear. J/I WNL. Normal speech. Appropriate eye contact and affect.  Skin:  No  Rash currently  Data Review No results found for: HGBA1C   Assessment & Plan   1. Mild intermittent asthma, unspecified whether complicated Stable-ok to resume - budesonide-formoterol (SYMBICORT) 80-4.5 MCG/ACT inhaler; Inhale 2 puffs into the lungs 2 (two) times daily.  Dispense: 1 Inhaler; Refill: 3 - albuterol (VENTOLIN HFA) 108 (90 Base) MCG/ACT inhaler; Inhale 2 puffs into the lungs every 6 (six) hours as needed for wheezing or shortness of breath.  Dispense: 1 Inhaler; Refill: 1  2. Screening for diabetes mellitus Not diabetic - Glucose (CBG)  3. Herpes zoster without complication No indication for ongoing valtrex-I will provide him with an Rx in the event he breaks out in a rash again.  I will also provide a RF of gabapentin but his PCP will need to decide if this is appropriate for ongoing and future use. - valACYclovir (VALTREX) 1000 MG tablet; Take 1 tablet (1,000 mg total) by mouth 3 (three) times daily.  Dispense: 21 tablet; Refill: 0 - gabapentin (NEURONTIN) 300 MG capsule; Take 2 capsules (600 mg total) by mouth 3 (three) times daily for 30 days.  Dispense: 180 capsule; Refill: 0  4. Encounter for examination following treatment at hospital [facility]     Patient have been counseled extensively about nutrition and exercise  Return in about 1 month (around 03/13/2019) for assign PCP; f/up neuropathy.  The patient was given clear instructions to go to ER or return to medical center if symptoms don't improve, worsen or new problems develop. The patient verbalized understanding. The patient was told to call to get lab results if they haven't heard anything in the next week.     Georgian Co, PA-C [facility] [place], [place] [phone]   02/10/2019, 2:51 PM

## 2019-02-10 ENCOUNTER — Ambulatory Visit: Payer: Self-pay | Attending: Critical Care Medicine | Admitting: Physician Assistant

## 2019-02-10 ENCOUNTER — Other Ambulatory Visit: Payer: Self-pay

## 2019-02-10 VITALS — BP 131/79 | HR 65 | Temp 98.6°F | Ht 73.0 in | Wt 191.4 lb

## 2019-02-10 DIAGNOSIS — Z09 Encounter for follow-up examination after completed treatment for conditions other than malignant neoplasm: Secondary | ICD-10-CM

## 2019-02-10 DIAGNOSIS — Z131 Encounter for screening for diabetes mellitus: Secondary | ICD-10-CM

## 2019-02-10 DIAGNOSIS — J4520 Mild intermittent asthma, uncomplicated: Secondary | ICD-10-CM

## 2019-02-10 DIAGNOSIS — B029 Zoster without complications: Secondary | ICD-10-CM

## 2019-02-10 LAB — GLUCOSE, POCT (MANUAL RESULT ENTRY): POC Glucose: 85 mg/dl (ref 70–99)

## 2019-02-10 MED ORDER — ALBUTEROL SULFATE HFA 108 (90 BASE) MCG/ACT IN AERS: 2.0000 | INHALATION_SPRAY | Freq: Four times a day (QID) | RESPIRATORY_TRACT | 1 refills | Status: DC | PRN

## 2019-02-10 MED ORDER — VALACYCLOVIR HCL 1 G PO TABS: 1000.0000 mg | ORAL_TABLET | Freq: Three times a day (TID) | ORAL | 0 refills | Status: DC

## 2019-02-10 MED ORDER — GABAPENTIN 300 MG PO CAPS: 600.0000 mg | ORAL_CAPSULE | Freq: Three times a day (TID) | ORAL | 0 refills | Status: DC

## 2019-02-10 MED ORDER — BUDESONIDE-FORMOTEROL FUMARATE 80-4.5 MCG/ACT IN AERO: 2.0000 | INHALATION_SPRAY | Freq: Two times a day (BID) | RESPIRATORY_TRACT | 3 refills | Status: DC

## 2019-03-23 ENCOUNTER — Encounter: Payer: Self-pay | Admitting: Nurse Practitioner

## 2019-03-23 ENCOUNTER — Other Ambulatory Visit: Payer: Self-pay

## 2019-03-23 ENCOUNTER — Ambulatory Visit: Payer: Self-pay | Attending: Nurse Practitioner | Admitting: Nurse Practitioner

## 2019-03-23 DIAGNOSIS — F17200 Nicotine dependence, unspecified, uncomplicated: Secondary | ICD-10-CM

## 2019-03-23 DIAGNOSIS — F17210 Nicotine dependence, cigarettes, uncomplicated: Secondary | ICD-10-CM

## 2019-03-23 DIAGNOSIS — Z125 Encounter for screening for malignant neoplasm of prostate: Secondary | ICD-10-CM

## 2019-03-23 DIAGNOSIS — Z13220 Encounter for screening for lipoid disorders: Secondary | ICD-10-CM

## 2019-03-23 DIAGNOSIS — J4520 Mild intermittent asthma, uncomplicated: Secondary | ICD-10-CM

## 2019-03-23 MED ORDER — FLUTICASONE-SALMETEROL 115-21 MCG/ACT IN AERO: 2.0000 | INHALATION_SPRAY | Freq: Two times a day (BID) | RESPIRATORY_TRACT | 12 refills | Status: DC

## 2019-03-23 MED ORDER — ALBUTEROL SULFATE HFA 108 (90 BASE) MCG/ACT IN AERS: 2.0000 | INHALATION_SPRAY | Freq: Four times a day (QID) | RESPIRATORY_TRACT | 0 refills | Status: DC | PRN

## 2019-03-23 NOTE — Progress Notes (Addendum)
Virtual Visit via Telephone Note Due to national recommendations of social distancing due to [place] 19, telehealth visit is felt to be most appropriate for this patient at this time.  I discussed the limitations, risks, security and privacy concerns of performing an evaluation and management service by telephone and the availability of in person appointments. I also discussed with the patient that there may be a patient responsible charge related to this service. The patient expressed understanding and agreed to proceed.    I connected with DEMON VOLANTE on 03/23/19  at  3:11 PM EDT  EDT by telephone and verified that I am speaking with the correct person using two identifiers.   Consent I discussed the limitations, risks, security and privacy concerns of performing an evaluation and management service by telephone and the availability of in person appointments. I also discussed with the patient that there may be a patient responsible charge related to this service. The patient expressed understanding and agreed to proceed.   Location of Patient: Private Residence   Location of Provider: [place] and [place] participating in Telemedicine visit: Geryl Rankins FNP-BC [place]    History of Present Illness: Telemedicine visit for: Establish care. He has a history of shingles and asthma. States he stopped taking gabapentin and post herpetic pain is currently mild and tolerable.   Asthma Chronic. He has had asthma since childhood. States his asthma onset stemmed from an episode of viral PNA. Has been using his albuterol inhaler excessively. Could not afford Symbicort. We had a long discussion regarding SABA vs ICS/LABA usage. He has been instructed to speak with the pharmacist regarding the patient assistance program for Advair. Patient's symptoms include chest tightness, dyspnea, non-productive cough and wheezing. Suspected precipitants  include cold air, pollens and temperature changes.  Patient has not required Emergency Room treatment for these symptoms, and has required hospitalization. The patient has not been intubated in the past.   Past Medical History:  Diagnosis Date  . Asthma   . Shingles     Past Surgical History:  Procedure Laterality Date  . NO PAST SURGERIES      Family History  Problem Relation Age of Onset  . Cancer Neg Hx   . Diabetes Neg Hx   . Hyperlipidemia Neg Hx   . Hypertension Neg Hx     Social History   Socioeconomic History  . Marital status: Single    Spouse name: Not on file  . Number of children: Not on file  . Years of education: Not on file  . Highest education level: Not on file  Occupational History  . Not on file  Social Needs  . Financial resource strain: Not on file  . Food insecurity:    Worry: Not on file    Inability: Not on file  . Transportation needs:    Medical: Not on file    Non-medical: Not on file  Tobacco Use  . Smoking status: Current Every Day Smoker    Packs/day: 1.00    Types: Cigarettes  . Smokeless tobacco: Never Used  Substance and Sexual Activity  . Alcohol use: Yes    Comment: occ  . Drug use: Never  . Sexual activity: Yes  Lifestyle  . Physical activity:    Days per week: Not on file    Minutes per session: Not on file  . Stress: Not on file  Relationships  . Social connections:  Talks on phone: Not on file    Gets together: Not on file    Attends religious service: Not on file    Active member of club or organization: Not on file    Attends meetings of clubs or organizations: Not on file    Relationship status: Not on file  Other Topics Concern  . Not on file  Social History Narrative  . Not on file     Observations/Objective: Awake, alert and oriented x 3   Review of Systems  Constitutional: Negative for fever, malaise/fatigue and weight loss.  HENT: Negative.  Negative for nosebleeds.   Eyes: Negative.  Negative  for blurred vision, double vision and photophobia.  Respiratory: Positive for cough, shortness of breath and wheezing.   Cardiovascular: Negative.  Negative for chest pain, palpitations and leg swelling.  Gastrointestinal: Negative.  Negative for heartburn, nausea and vomiting.  Musculoskeletal: Negative.  Negative for myalgias.  Neurological: Positive for tingling and sensory change. Negative for dizziness, focal weakness, seizures and headaches.  Psychiatric/Behavioral: Negative.  Negative for suicidal ideas.    Assessment and Plan: Diagnoses and all orders for this visit:  Mild intermittent asthma, unspecified whether complicated -     albuterol (VENTOLIN HFA) 108 (90 Base) MCG/ACT inhaler; Inhale 2 puffs into the lungs every 6 (six) hours as needed for wheezing or shortness of breath. -     fluticasone-salmeterol (ADVAIR HFA) 115-21 MCG/ACT inhaler; Inhale 2 puffs into the lungs 2 (two) times daily. Discussed asthma action PLAN  Prostate cancer screening -     PSA; Future  Screening cholesterol level -     Lipid panel; Future  Tobacco dependence -     CBC; Future -     CMP14+EGFR; Future Lucian was counseled on the dangers of tobacco use, and was advised to quit. Reviewed strategies to maximize success, including removing cigarettes and smoking materials from environment, stress management and support of family/friends as well as pharmacological alternatives including: Wellbutrin, Chantix, Nicotine patch, Nicotine gum or lozenges. Smoking cessation support: smoking cessation hotline: 1-800-QUIT-NOW.  Smoking cessation classes are also available through [facility]. Call [phone] or visit our website at [email or web address]   A total of 4 minutes was spent on counseling for smoking cessation and Hannan is not ready to quit.      Follow Up Instructions Return in about 3 months (around 06/22/2019).     I discussed the assessment and treatment plan  with the patient. The patient was provided an opportunity to ask questions and all were answered. The patient agreed with the plan and demonstrated an understanding of the instructions.   The patient was advised to call back or seek an in-person evaluation if the symptoms worsen or if the condition fails to improve as anticipated.  I provided 21 minutes of non-face-to-face time during this encounter including median intraservice time, reviewing previous notes, labs, imaging, medications and explaining diagnosis and management.  Gildardo Pounds, FNP-BC

## 2019-03-24 MED FILL — !ADVAIR HFA 115-21 MCG INHA: 115-21 | 30 days supply | Qty: 12 | Fill #0

## 2019-03-30 ENCOUNTER — Other Ambulatory Visit: Payer: Self-pay

## 2019-07-24 ENCOUNTER — Encounter (HOSPITAL_COMMUNITY): Payer: Self-pay | Admitting: Emergency Medicine

## 2019-07-24 ENCOUNTER — Other Ambulatory Visit: Payer: Self-pay

## 2019-07-24 ENCOUNTER — Observation Stay (HOSPITAL_COMMUNITY)
Admission: RE | Admit: 2019-07-24 | Discharge: 2019-07-25 | Disposition: A | Discharge status: Home / Self Care | Payer: Self-pay | Attending: Psychiatry | Admitting: Psychiatry

## 2019-07-24 DIAGNOSIS — Z590 Homelessness: Secondary | ICD-10-CM | POA: Insufficient documentation

## 2019-07-24 DIAGNOSIS — R45851 Suicidal ideations: Secondary | ICD-10-CM | POA: Insufficient documentation

## 2019-07-24 DIAGNOSIS — F329 Major depressive disorder, single episode, unspecified: Principal | ICD-10-CM | POA: Diagnosis present

## 2019-07-24 DIAGNOSIS — Z20828 Contact with and (suspected) exposure to other viral communicable diseases: Secondary | ICD-10-CM | POA: Insufficient documentation

## 2019-07-24 DIAGNOSIS — Z79899 Other long term (current) drug therapy: Secondary | ICD-10-CM | POA: Insufficient documentation

## 2019-07-24 DIAGNOSIS — R4584 Anhedonia: Secondary | ICD-10-CM | POA: Insufficient documentation

## 2019-07-24 DIAGNOSIS — F17210 Nicotine dependence, cigarettes, uncomplicated: Secondary | ICD-10-CM | POA: Insufficient documentation

## 2019-07-24 DIAGNOSIS — Z7951 Long term (current) use of inhaled steroids: Secondary | ICD-10-CM | POA: Insufficient documentation

## 2019-07-24 DIAGNOSIS — J45909 Unspecified asthma, uncomplicated: Secondary | ICD-10-CM | POA: Insufficient documentation

## 2019-07-24 DIAGNOSIS — F419 Anxiety disorder, unspecified: Secondary | ICD-10-CM | POA: Insufficient documentation

## 2019-07-24 DIAGNOSIS — F321 Major depressive disorder, single episode, moderate: Secondary | ICD-10-CM

## 2019-07-24 DIAGNOSIS — Z560 Unemployment, unspecified: Secondary | ICD-10-CM | POA: Insufficient documentation

## 2019-07-24 LAB — SARS CORONAVIRUS 2 BY RT PCR (HOSPITAL ORDER, PERFORMED IN ~~LOC~~ HOSPITAL LAB): SARS Coronavirus 2: NEGATIVE

## 2019-07-24 MED ORDER — ALBUTEROL SULFATE HFA 108 (90 BASE) MCG/ACT IN AERS
1.0000 | INHALATION_SPRAY | Freq: Four times a day (QID) | RESPIRATORY_TRACT | Status: DC | PRN
  Administered 2019-07-24 – 2019-07-25 (×3): 2 via RESPIRATORY_TRACT
  Filled 2019-07-24 (×2): qty 6.7

## 2019-07-24 MED ORDER — ACETAMINOPHEN 325 MG PO TABS
650.0000 mg | ORAL_TABLET | Freq: Four times a day (QID) | ORAL | Status: DC | PRN
  Filled 2019-07-24: qty 2

## 2019-07-24 MED ORDER — ALUM & MAG HYDROXIDE-SIMETH 200-200-20 MG/5ML PO SUSP
30.0000 mL | ORAL | Status: DC | PRN
  Filled 2019-07-24: qty 30

## 2019-07-24 MED ORDER — AZITHROMYCIN 250 MG PO TABS
250.0000 mg | ORAL_TABLET | Freq: Every day | ORAL | Status: DC
  Filled 2019-07-24 (×2): qty 1

## 2019-07-24 MED ORDER — MAGNESIUM HYDROXIDE 400 MG/5ML PO SUSP
30.0000 mL | Freq: Every day | ORAL | Status: DC | PRN
  Filled 2019-07-24: qty 30

## 2019-07-24 MED ORDER — AZITHROMYCIN 250 MG PO TABS
500.0000 mg | ORAL_TABLET | Freq: Every day | ORAL | Status: AC
  Administered 2019-07-24: 500 mg via ORAL
  Filled 2019-07-24: qty 2
  Filled 2019-07-24: qty 1

## 2019-07-24 MED ORDER — HYDROXYZINE HCL 25 MG PO TABS
25.0000 mg | ORAL_TABLET | Freq: Three times a day (TID) | ORAL | Status: DC | PRN
  Filled 2019-07-24: qty 1

## 2019-07-24 MED ORDER — TRAZODONE HCL 50 MG PO TABS
50.0000 mg | ORAL_TABLET | Freq: Every evening | ORAL | Status: DC | PRN
  Filled 2019-07-24: qty 1
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
## 2019-07-24 NOTE — ED Notes (Signed)
   07/24/19 2140  Alcohol Screening Tool (AUDIT)  Patient refused Alcohol Screening Tool Yes  1. How often do you have a drink containing alcohol? 1  2. How many drinks containing alcohol do you have on a typical day when you are drinking? 0  3. How often do you have six or more drinks on one occasion? 0  AUDIT-C Score 1  Alcohol Brief Interventions/Follow-up AUDIT Score <7 follow-up not indicated

## 2019-07-24 NOTE — Progress Notes (Signed)
Patient ID: Dylan Wood, male   DOB: [birth year], 58 y.o.   MRN: [id number] Pt presents with SI, plan to cut his wrists, financial stressors, loss of job, repossession of car, eviction from apartment.  Denies HI or AVH.  Pt depressed and anxious.  A&O x 3, calm & cooperative.  Skin search completed, pt changed into scrubs.  Pending results of COVID test.  Monitoring for safety.

## 2019-07-24 NOTE — Plan of Care (Signed)
[facility] Observation Crisis Plan  Reason for Crisis Plan:  Crisis Stabilization   Plan of Care:  Referral for Inpatient Hospitalization  Family Support:   Yes   Current Living Environment:  Living Arrangements: Other (Comment)(Evicted today)  Insurance:   Hospital Account    Name Acct ID Class Status Primary Coverage   Dylan Wood, Dylan Wood [id number] [place] [facility] None        Guarantor Account (for Hospital Account [id number])    Name Relation to Pt Service Area Active? Acct Type   Dylan Wood, Dylan Wood   Address Phone       [address] [place], [place] [address] [phone])          Coverage Information (for Hospital Account [id number])    Not on file      Legal Guardian:  Legal Guardian: Other:(Self-pay)  Primary Care Provider:  Gildardo Pounds, NP  Current Outpatient Providers:  Dylan Dresser, DO  Psychiatrist:  Name of Psychiatrist: None  Counselor/Therapist:  Name of Therapist: None  Compliant with Medications:  Yes  Additional Information:   Dylan Wood 8/30/20209:12 PM

## 2019-07-24 NOTE — H&P (Addendum)
Behavioral Health Medical Screening Exam  Dylan Wood is an 58 y.o. male.  Dylan Wood is an 58 y.o. male.  Patient presents to [facility] as a walk-in.  Reported passive suicidal ideations due to multiple stressors.  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  Dylan Wood reported that his vehicle was repossessed this morning and he is currently homess as he has beeen unable to pay is rent.   Denies history of suicidal ideation during this assessment. " I just dont want to be here in this world."  Patient is denying intent or plan.  (Situational depression) " too much all at one time."   Denies history of family history of mental illness.   Patient denies substance abuse use or illicit druge use.  Reports his mother at the nursing home who is a support system however she is not able to help him financially. Support, encouragement and reassurance was provided.   Total Time spent with patient: 15 minutes  Psychiatric Specialty Exam: Physical Exam  Nursing note and vitals reviewed. Constitutional: He appears well-developed.  HENT:  Head: Normocephalic.  Musculoskeletal: Normal range of motion.  Psychiatric: He has a normal mood and affect. His behavior is normal.    Review of Systems  Psychiatric/Behavioral: Positive for depression and suicidal ideas (passive ideations). The patient is nervous/anxious.   All other systems reviewed and are negative.   There were no vitals taken for this visit.There is no height or weight on file to calculate BMI.  General Appearance: Guarded  Eye Contact:  Fair  Speech:  Clear and Coherent  Volume:  Normal  Mood:  Anxious and Depressed  Affect:  Congruent  Thought Process:  Coherent  Orientation:  Full (Time, Place, and Person)  Thought Content:  Hallucinations: None  Suicidal Thoughts:  No passive ideaitons  Homicidal Thoughts:  No  Memory:  Immediate;   Fair Remote;   Fair  Judgement:  Fair  Insight:  Good   Psychomotor Activity:  Normal  Concentration: Concentration: Fair  Recall:  [place]: Fair  Akathisia:  No  Handed:  Right  AIMS (if indicated):     Assets:  Communication Skills Desire for Improvement Resilience Social Support  Sleep:       Musculoskeletal: Strength & Muscle Tone: within normal limits Gait & Station: normal Patient leans: N/A  There were no vitals taken for this visit.  Recommendations: Observation Based on my evaluation the patient does not appear to have an emergency medical condition.  Derrill Center, NP 07/24/2019, 6:59 PM

## 2019-07-24 NOTE — H&P (Signed)
BH Observation Unit Provider Admission PAA/H&P  Patient Identification: Dylan Wood MRN:  [id number] Date of Evaluation:  07/24/2019 Chief Complaint:  Pending Principal Diagnosis: MDD (major depressive disorder) Diagnosis:  Principal Problem:   MDD (major depressive disorder)  History of Present Illness: Dylan Wood is an 58 y.o. male.  Patient presents to [facility] as a walk-in.  Reported passive suicidal ideations due to multiple stressors.  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  Jailyn reported that his vehicle was repossessed this morning and he is currently homess as he has beeen unable to pay is rent.   Denies history of suicidal ideation during this assessment. " I just dont want to be here in this world."  Patient is denying intent or plan.  (Situational depression) " too much all at one time."   Denies history of family history of mental illness.   Patient denies substance abuse use or illicit druge use.  Reports his mother at the nursing home who is a support system however she is not able to help him financially. Support, encouragement and reassurance was provided.   Associated Signs/Symptoms: Depression Symptoms:  depressed mood, anhedonia, feelings of worthlessness/guilt, difficulty concentrating, hopelessness, anxiety, (Hypo) Manic Symptoms:  Distractibility, Anxiety Symptoms:  Excessive Worry, Social Anxiety, Psychotic Symptoms:  Hallucinations: None PTSD Symptoms: NA Total Time spent with patient: 15 minutes  Past Psychiatric History:   Is the patient at risk to self? Yes.    Has the patient been a risk to self in the past 6 months? Yes.    Has the patient been a risk to self within the distant past? No.  Is the patient a risk to others? No.  Has the patient been a risk to others in the past 6 months? No.  Has the patient been a risk to others within the distant past? No.   Prior Inpatient Therapy:   Prior Outpatient  Therapy:    Alcohol Screening:   Substance Abuse History in the last 12 months:  No. Consequences of Substance Abuse: NA Previous Psychotropic Medications: No  Psychological Evaluations: No  Past Medical History:  Past Medical History:  Diagnosis Date  . Asthma   . Shingles     Past Surgical History:  Procedure Laterality Date  . NO PAST SURGERIES     Family History:  Family History  Problem Relation Age of Onset  . Cancer Neg Hx   . Diabetes Neg Hx   . Hyperlipidemia Neg Hx   . Hypertension Neg Hx    Family Psychiatric History: denied Tobacco Screening:   Social History:  Social History   Substance and Sexual Activity  Alcohol Use Yes   Comment: occ     Social History   Substance and Sexual Activity  Drug Use Never    Additional Social History:                           Allergies:  No Known Allergies Lab Results: No results found for this or any previous visit (from the past 48 hour(s)).  Blood Alcohol level:  No results found for: [facility]  Metabolic Disorder Labs:  No results found for: HGBA1C, MPG No results found for: PROLACTIN No results found for: CHOL, TRIG, HDL, CHOLHDL, VLDL, LDLCALC  Current Medications: Current Outpatient Medications  Medication Sig Dispense Refill  . acetaminophen (TYLENOL) 650 MG CR tablet Take 650-1,300 mg by mouth every 8 (eight) hours as  needed for pain.    Marland Kitchen albuterol (VENTOLIN HFA) 108 (90 Base) MCG/ACT inhaler Inhale 2 puffs into the lungs every 6 (six) hours as needed for wheezing or shortness of breath. 1 Inhaler 0  . budesonide-formoterol (SYMBICORT) 80-4.5 MCG/ACT inhaler Inhale 2 puffs into the lungs 2 (two) times daily. (Patient not taking: Reported on 03/23/2019) 1 Inhaler 3  . cyclobenzaprine (FLEXERIL) 10 MG tablet Take 1 tablet (10 mg total) by mouth at bedtime. 10 tablet 0  . docusate sodium (COLACE) 250 MG capsule Take 1 capsule (250 mg total) by mouth daily as needed for constipation (while taking  narcotic pain medication). 10 capsule 0  . fluticasone-salmeterol (ADVAIR HFA) 115-21 MCG/ACT inhaler Inhale 2 puffs into the lungs 2 (two) times daily. 1 Inhaler 12  . gabapentin (NEURONTIN) 300 MG capsule Take 2 capsules (600 mg total) by mouth 3 (three) times daily for 30 days. 180 capsule 0  . meloxicam (MOBIC) 7.5 MG tablet Take 1 tablet (7.5 mg total) by mouth daily. (Patient not taking: Reported on 02/10/2019) 30 tablet 0  . naproxen sodium (ALEVE) 220 MG tablet Take 220-440 mg by mouth 2 (two) times daily as needed (for pain).    . ondansetron (ZOFRAN ODT) 4 MG disintegrating tablet 4mg  ODT q4 hours prn nausea/vomit (Patient not taking: Reported on 03/23/2019) 20 tablet 0  . valACYclovir (VALTREX) 1000 MG tablet Take 1 tablet (1,000 mg total) by mouth 3 (three) times daily. (Patient not taking: Reported on 03/23/2019) 21 tablet 0   Current Facility-Administered Medications  Medication Dose Route Frequency Provider Last Rate Last Dose  . acetaminophen (TYLENOL) tablet 650 mg  650 mg Oral Q6H PRN , Marcy Panning, NP      . albuterol (VENTOLIN HFA) 108 (90 Base) MCG/ACT inhaler 1-2 puff  1-2 puff Inhalation Q6H PRN Derrill Center, NP      . alum & mag hydroxide-simeth (MAALOX/MYLANTA) 200-200-20 MG/5ML suspension 30 mL  30 mL Oral Q4H PRN Derrill Center, NP      . azithromycin (ZITHROMAX) tablet 500 mg  500 mg Oral Daily Derrill Center, NP       Followed by  . [START ON 07/25/2019] azithromycin (ZITHROMAX) tablet 250 mg  250 mg Oral Daily Derrill Center, NP      . hydrOXYzine (ATARAX/VISTARIL) tablet 25 mg  25 mg Oral TID PRN Derrill Center, NP      . magnesium hydroxide (MILK OF MAGNESIA) suspension 30 mL  30 mL Oral Daily PRN Derrill Center, NP      . traZODone (DESYREL) tablet 50 mg  50 mg Oral QHS PRN Derrill Center, NP       PTA Medications: (Not in a hospital admission)   Musculoskeletal: Strength & Muscle Tone: within normal limits Gait & Station: normal Patient leans:  N/A  Psychiatric Specialty Exam: Physical Exam  ROS  There were no vitals taken for this visit.There is no height or weight on file to calculate BMI.  General Appearance: Casual  Eye Contact:  Good  Speech:  Clear and Coherent  Volume:  Normal  Mood:  Anxious and Depressed  Affect:  Congruent  Thought Process:  Coherent  Orientation:  Full (Time, Place, and Person)  Thought Content:  Logical  Suicidal Thoughts:  Yes.  with intent/plan  Homicidal Thoughts:  No  Memory:  Immediate;   Fair Remote;   Fair  Judgement:  Fair  Insight:  Fair  Psychomotor Activity:  Normal  Concentration:  Concentration: Fair  Recall:  [employer]Fair  Fund of Knowledge:  Fair  Language:  Good  Akathisia:  No  Handed:  Right  AIMS (if indicated):     Assets:  Communication Skills Desire for Improvement Resilience Social Support  ADL's:  Intact  Cognition:  WNL  Sleep:         Treatment Plan Summary: Daily contact with patient to assess and evaluate symptoms and progress in treatment and Medication management   -  Observation Level/Precautions:  15 minute checks Laboratory:  CBC Chemistry Profile UDS UA Psychotherapy:   Medications:   Consultations:  CSW and Psychiatrist  Discharge Concerns:   Estimated LOS: overnight obesv  Other:      Oneta Rackanika N , NP 8/30/20207:18 PM

## 2019-07-25 LAB — COMPREHENSIVE METABOLIC PANEL
ALT: 15 U/L (ref 0–44)
AST: 21 U/L (ref 15–41)
Albumin: 4.6 g/dL (ref 3.5–5.0)
Alkaline Phosphatase: 81 U/L (ref 38–126)
Anion gap: 11 (ref 5–15)
BUN: 12 mg/dL (ref 6–20)
CO2: 29 mmol/L (ref 22–32)
Calcium: 9.7 mg/dL (ref 8.9–10.3)
Chloride: 101 mmol/L (ref 98–111)
Creatinine, Ser: 0.87 mg/dL (ref 0.61–1.24)
GFR calc Af Amer: >60 mL/min (ref >60)
GFR calc non Af Amer: >60 mL/min (ref >60)
Glucose, Bld: 102 mg/dL — ABNORMAL HIGH (ref 70–99)
Potassium: 4 mmol/L (ref 3.5–5.1)
Sodium: 141 mmol/L (ref 135–145)
Total Bilirubin: 0.9 mg/dL (ref 0.3–1.2)
Total Protein: 7.6 g/dL (ref 6.5–8.1)

## 2019-07-25 LAB — TSH: TSH: 0.748 u[IU]/mL (ref 0.350–4.500)

## 2019-07-25 LAB — CBC
HCT: 45.3 % (ref 39.0–52.0)
Hemoglobin: 14.8 g/dL (ref 13.0–17.0)
MCH: 31 pg (ref 26.0–34.0)
MCHC: 32.7 g/dL (ref 30.0–36.0)
MCV: 95 fL (ref 80.0–100.0)
Platelets: 311 10*3/uL (ref 150–400)
RBC: 4.77 MIL/uL (ref 4.22–5.81)
RDW: 12.8 % (ref 11.5–15.5)
WBC: 7.8 10*3/uL (ref 4.0–10.5)
nRBC: 0 % (ref 0.0–0.2)

## 2019-07-25 LAB — LIPID PANEL
Cholesterol: 236 mg/dL — ABNORMAL HIGH (ref 0–200)
HDL: 53 mg/dL (ref >40)
LDL Cholesterol: 159 mg/dL — ABNORMAL HIGH (ref 0–99)
Total CHOL/HDL Ratio: 4.5 RATIO
Triglycerides: 122 mg/dL (ref <150)
VLDL: 24 mg/dL (ref 0–40)

## 2019-07-25 LAB — ETHANOL: Alcohol, Ethyl (B): <10 mg/dL (ref <10)

## 2019-07-25 MED ORDER — TRAZODONE HCL 50 MG PO TABS: 50.0000 mg | ORAL_TABLET | Freq: Every evening | ORAL | Status: DC | PRN

## 2019-07-25 MED ORDER — HYDROXYZINE HCL 25 MG PO TABS: 25.0000 mg | ORAL_TABLET | Freq: Three times a day (TID) | ORAL | Status: DC | PRN

## 2019-07-25 MED ORDER — ACETAMINOPHEN 325 MG PO TABS: 650.0000 mg | ORAL_TABLET | Freq: Four times a day (QID) | ORAL | Status: DC | PRN

## 2019-07-25 MED ORDER — AZITHROMYCIN 250 MG PO TABS
250.0000 mg | ORAL_TABLET | Freq: Every day | ORAL | Status: DC
  Administered 2019-07-25: 08:00:00 250 mg via ORAL
  Filled 2019-07-25: qty 1

## 2019-07-25 NOTE — Consult Note (Addendum)
[facility]  07/25/2019 1:23 PM Ernestine Conradheodore J Eidem  MRN:  [id number] Principal Problem: MDD (major depressive disorder) Discharge Diagnoses: Principal Problem:   MDD (major depressive disorder)   Subjective: [facility] experiencing feelings of being overwhelmed after "I lost my job because I was sick for a long time, then I was evicted from my home and my car was repossessed. I also had a heated conversation with my mom, she gave me the number for NA and I don't use drugs!"  Patient denies SI, HI and AVH. Patient denies hx of self harm and mental health treatment/diagnoses.  Patient alert and oriented during evaluation, answers all questions appropriately. Patient requests information for housing resources, denies any family or friends he can live with.  Total Time spent with patient: 30 minutes  Past Psychiatric History: Denies.  Past Medical History:  Past Medical History:  Diagnosis Date  . Asthma   . Shingles     Past Surgical History:  Procedure Laterality Date  . NO PAST SURGERIES     Family History:  Family History  Problem Relation Age of Onset  . Cancer Neg Hx   . Diabetes Neg Hx   . Hyperlipidemia Neg Hx   . Hypertension Neg Hx    Family Psychiatric  History:  denies Social History:  Social History   Substance and Sexual Activity  Alcohol Use Yes   Comment: occ     Social History   Substance and Sexual Activity  Drug Use Never    Social History   Socioeconomic History  . Marital status: Single    Spouse name: Not on file  . Number of children: Not on file  . Years of education: Not on file  . Highest education level: Not on file  Occupational History  . Not on file  Social Needs  . Financial resource strain: Not on file  . Food insecurity    Worry: Not on file    Inability: Not on file  . Transportation needs    Medical: Not on file    Non-medical: Not on file  Tobacco Use  .  Smoking status: Current Every Day Smoker    Packs/day: 0.50    Types: Cigarettes  . Smokeless tobacco: Never Used  Substance and Sexual Activity  . Alcohol use: Yes    Comment: occ  . Drug use: Never  . Sexual activity: Yes  Lifestyle  . Physical activity    Days per week: Not on file    Minutes per session: Not on file  . Stress: Not on file  Relationships  . Social Musicianconnections    Talks on phone: Not on file    Gets together: Not on file    Attends religious service: Not on file    Active member of club or organization: Not on file    Attends meetings of clubs or organizations: Not on file    Relationship status: Not on file  Other Topics Concern  . Not on file  Social History Narrative  . Not on file    Has this patient used any form of tobacco in the last 30 days? (Cigarettes, Smokeless Tobacco, Cigars, and/or Pipes) A prescription for an FDA-approved tobacco cessation medication was offered at discharge and the patient refused  Current Medications: Current Facility-Administered Medications  Medication Dose Route Frequency Provider Last Rate Last Dose  . acetaminophen (TYLENOL) tablet 650 mg  650 mg Oral Q6H PRN Cherly BeachNorman, Despina Boan J,  DO      . albuterol (VENTOLIN HFA) 108 (90 Base) MCG/ACT inhaler 1-2 puff  1-2 puff Inhalation Q6H PRN Oneta Rack, NP   2 puff at 07/25/19 0813  . alum & mag hydroxide-simeth (MAALOX/MYLANTA) 200-200-20 MG/5ML suspension 30 mL  30 mL Oral Q4H PRN Oneta Rack, NP      . azithromycin (ZITHROMAX) tablet 250 mg  250 mg Oral Daily Cherly Beach, DO   250 mg at 07/25/19 3491  . hydrOXYzine (ATARAX/VISTARIL) tablet 25 mg  25 mg Oral TID PRN Cherly Beach, DO      . magnesium hydroxide (MILK OF MAGNESIA) suspension 30 mL  30 mL Oral Daily PRN Oneta Rack, NP      . traZODone (DESYREL) tablet 50 mg  50 mg Oral QHS PRN Cherly Beach, DO       PTA Medications: Medications Prior to Admission  Medication Sig Dispense  Refill Last Dose  . albuterol (VENTOLIN HFA) 108 (90 Base) MCG/ACT inhaler Inhale 2 puffs into the lungs every 6 (six) hours as needed for wheezing or shortness of breath. 1 Inhaler 0     Musculoskeletal: Strength & Muscle Tone: within normal limits Gait & Station: normal Patient leans: N/A  Psychiatric Specialty Exam: Physical Exam  Nursing note and vitals reviewed. Constitutional: He is oriented to person, place, and time. He appears well-developed.  HENT:  Head: Normocephalic.  Cardiovascular: Normal rate.  Respiratory: Effort normal.  Neurological: He is alert and oriented to person, place, and time.  Psychiatric: He has a normal mood and affect. His behavior is normal. Judgment and thought content normal.    Review of Systems  Constitutional: Negative.   HENT: Negative.   Eyes: Negative.   Respiratory: Negative.   Cardiovascular: Negative.   Gastrointestinal: Negative.   Genitourinary: Negative.   Musculoskeletal: Negative.   Skin: Negative.   Neurological: Negative.   Endo/Heme/Allergies: Negative.   Psychiatric/Behavioral: Positive for depression (Stressors related to loss of income). Negative for hallucinations, memory loss, substance abuse and suicidal ideas. The patient is not nervous/anxious and does not have insomnia.     Blood pressure (!) 146/90, pulse 83, temperature 97.9 F (36.6 C), temperature source Oral, resp. rate 20, SpO2 100 %.There is no height or weight on file to calculate BMI.  General Appearance: Casual  Eye Contact:  Good  Speech:  Clear and Coherent  Volume:  Normal  Mood:  appropriate  Affect:  Congruent  Thought Process:  Coherent and Descriptions of Associations: Intact  Orientation:  Full (Time, Place, and Person)  Thought Content:  Logical  Suicidal Thoughts:  No  Homicidal Thoughts:  No  Memory:  Immediate;   Good Recent;   Good Remote;   Good  Judgement:  Intact  Insight:  Good  Psychomotor Activity:  Normal  Concentration:   Concentration: Good and Attention Span: Good  Recall:  Good  Fund of Knowledge:  Good  Language:  Good  Akathisia:  No  Handed:  Right  AIMS (if indicated):   N/A  Assets:  Communication Skills Desire for Improvement Physical Health  ADL's:  Intact  Cognition:  WNL  Sleep:   N/A     Demographic Factors:  Male, Caucasian and Unemployed  Loss Factors: Decrease in vocational status and Financial problems/change in socioeconomic status  Historical Factors: NA  Risk Reduction Factors:   Positive social support and Positive coping skills or problem solving skills  Continued Clinical Symptoms:  Depression  Cognitive Features That Contribute To Risk:  None    Suicide Risk:  Minimal: No identifiable suicidal ideation.  Patients presenting with no risk factors but with morbid ruminations; may be classified as minimal risk based on the severity of the depressive symptoms    Plan Of Care/Follow-up recommendations:  Other:  Discharge home with resources for housing  Disposition: Take all medications as prescribed. Please attend all follow-up appointments as scheduled. Report any side effects to your outpatient psychiatrist. Abstain from alcohol and illegal drugs while taking prescription medications. In the event of worsening symptoms call the crisis hotline, 911 or go to the nearest emergency department for evaluation and treatment.  Emmaline Kluver, FNP  07/25/2019, 1:23 PM   Patient seen face-to-face for psychiatric evaluation, chart reviewed and case discussed with the physician extender and developed treatment plan. Reviewed the information documented and agree with the treatment plan.  Buford Dresser, DO 07/25/19 4:31 PM

## 2019-07-25 NOTE — Discharge Instructions (Signed)
For your behavioral health needs you are advised to follow up with [facility].  New patients are seen at their walk-in clinic.  Walk-in hours are Monday - Friday from 8:30 am - 12:00 pm, and from 1:00 pm - 2:30 pm.  Walk-in patients are seen on a first come, first served basis, so try to arrive as early as possible for the best chance of being seen the same day:       [facility]      [address]      [place], [place] [address]      [phone] 

## 2019-07-25 NOTE — Plan of Care (Signed)
Discharge note  Pt has complained of SOB throughout the day and provided medications according per orders. Pt is continually restless, anxious, and voicing multiple concerns with staff. Pt denies si/hi/ah/vh and verbally agrees to approach staff if these become apparent or before harming himself/others while at [facility]. Patient verbalizes readiness for discharge. Follow up plan explained; AVS provided.  Belongings returned and signed for. Patient denies SI/HI and assures this Probation officer he will seek assistance should that change. Patient discharged to courtyard.  Problem: Education: Goal: Knowledge of [facility] Education information/materials will improve Outcome: Adequate for Discharge Goal: Emotional status will improve Outcome: Adequate for Discharge Goal: Mental status will improve Outcome: Adequate for Discharge Goal: Verbalization of understanding the information provided will improve Outcome: Adequate for Discharge   Problem: Activity: Goal: Interest or engagement in activities will improve Outcome: Adequate for Discharge Goal: Sleeping patterns will improve Outcome: Adequate for Discharge   Problem: Coping: Goal: Ability to verbalize frustrations and anger appropriately will improve Outcome: Adequate for Discharge Goal: Ability to demonstrate self-control will improve Outcome: Adequate for Discharge   Problem: Health Behavior/Discharge Planning: Goal: Identification of resources available to assist in meeting health care needs will improve Outcome: Adequate for Discharge Goal: Compliance with treatment plan for underlying cause of condition will improve Outcome: Adequate for Discharge   Problem: Physical Regulation: Goal: Ability to maintain clinical measurements within normal limits will improve Outcome: Adequate for Discharge   Problem: Safety: Goal: Periods of time without injury will increase Outcome: Adequate for Discharge   Problem: Education: Goal: Ability to  state activities that reduce stress will improve Outcome: Adequate for Discharge   Problem: Coping: Goal: Ability to identify and develop effective coping behavior will improve Outcome: Adequate for Discharge   Problem: Self-Concept: Goal: Ability to identify factors that promote anxiety will improve Outcome: Adequate for Discharge Goal: Level of anxiety will decrease Outcome: Adequate for Discharge Goal: Ability to modify response to factors that promote anxiety will improve Outcome: Adequate for Discharge   Problem: Activity: Goal: Will identify at least one activity in which they can participate Outcome: Adequate for Discharge   Problem: Coping: Goal: Ability to identify and develop effective coping behavior will improve Outcome: Adequate for Discharge Goal: Ability to interact with others will improve Outcome: Adequate for Discharge Goal: Demonstration of participation in decision-making regarding own care will improve Outcome: Adequate for Discharge Goal: Ability to use eye contact when communicating with others will improve Outcome: Adequate for Discharge   Problem: Health Behavior/Discharge Planning: Goal: Identification of resources available to assist in meeting health care needs will improve Outcome: Adequate for Discharge   Problem: Self-Concept: Goal: Will verbalize positive feelings about self Outcome: Adequate for Discharge

## 2019-07-25 NOTE — BH Assessment (Signed)
[facility] Assessment Progress Note  Per Buford Dresser, DO, this pt does not require psychiatric hospitalization at this time.  Pt is to be discharged from the [facility] Observation Unit with recommendation to follow up with [facility].  This has been included in pt's discharge instructions.  Pt's nurse has been notified.  Jalene Mullet, [place] Triage Specialist [phone]

## 2019-07-27 ENCOUNTER — Telehealth: Payer: Self-pay | Admitting: Nurse Practitioner

## 2019-07-27 NOTE — Telephone Encounter (Signed)
Patient called wanting to know if someone could follow up with him due to him having some sinus issues going on. Patient was scheduled for an appointment however, still wanted to FU with someone in regards to whats going on. Please follow up

## 2019-07-28 IMAGING — CR DG LUMBAR SPINE COMPLETE 4+V
5 series · 5 of 5 positions shown · non-contrast
Comparison: None.

CLINICAL DATA: Fall last week, pain to mid back. Self medicated
with naproxen on [REDACTED] and on [REDACTED] developed a rash on this
back and stomach

EXAM:
LUMBAR SPINE - COMPLETE 4+ VIEW

[l-spine ap]
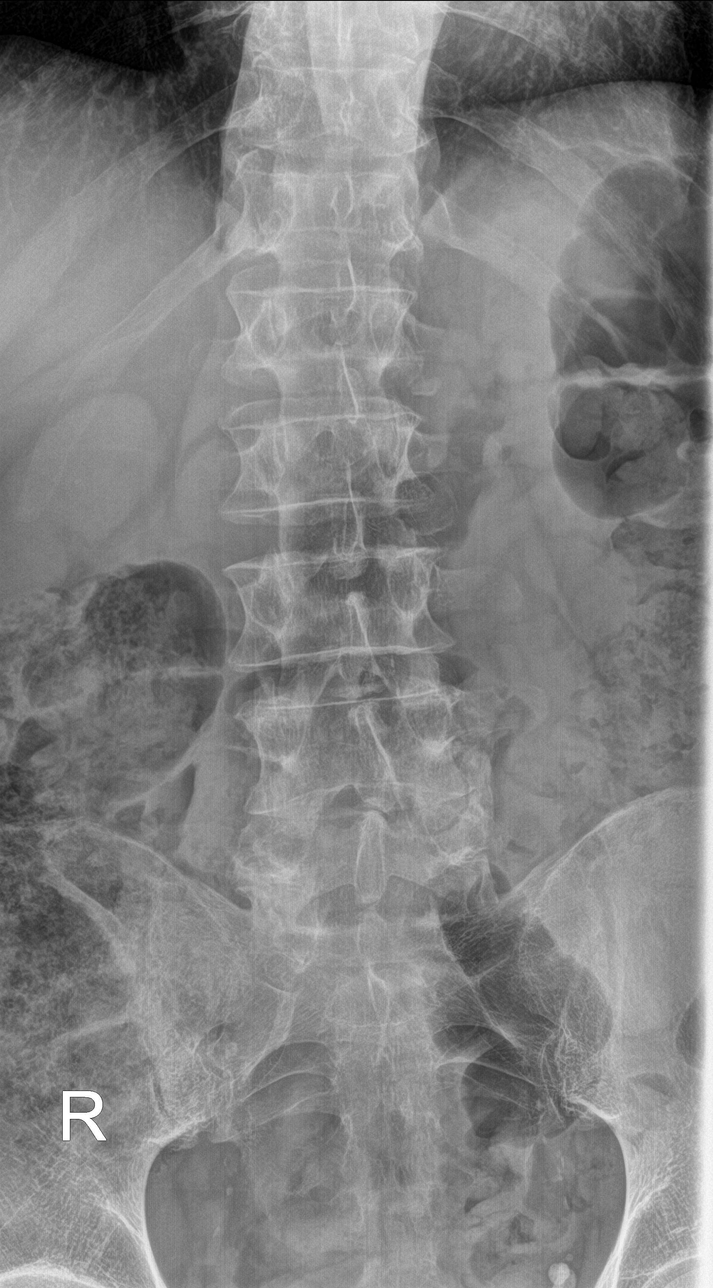

[l-spine obl (1 of 2)]
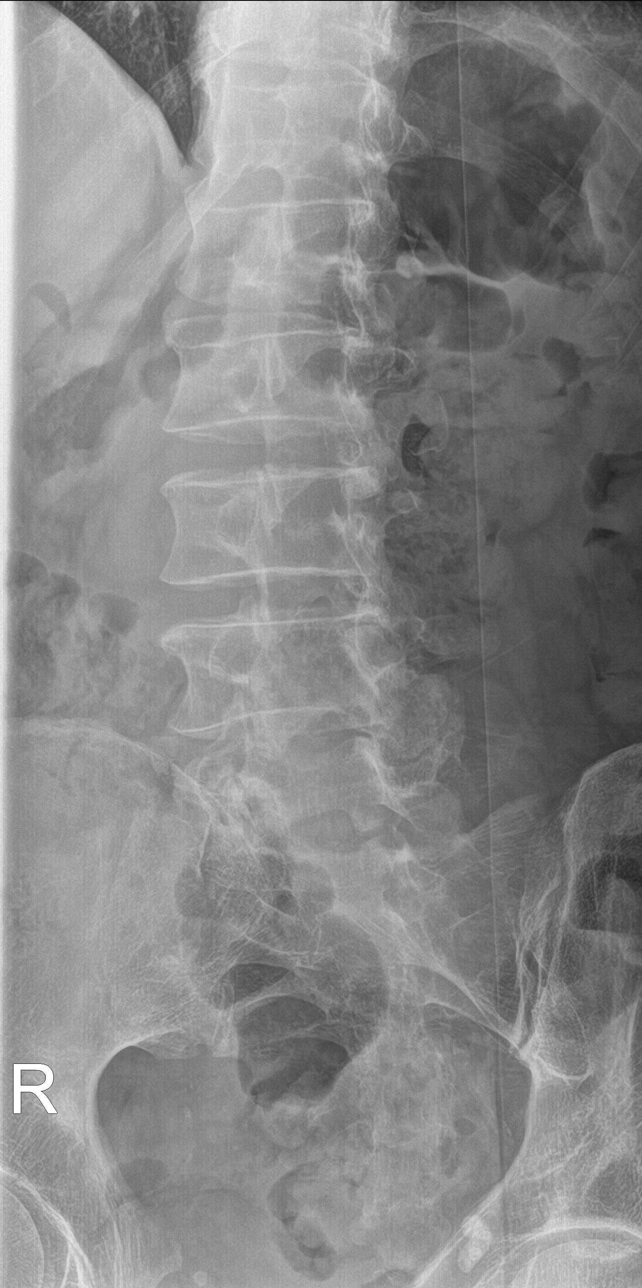

[l-spine obl (2 of 2)]
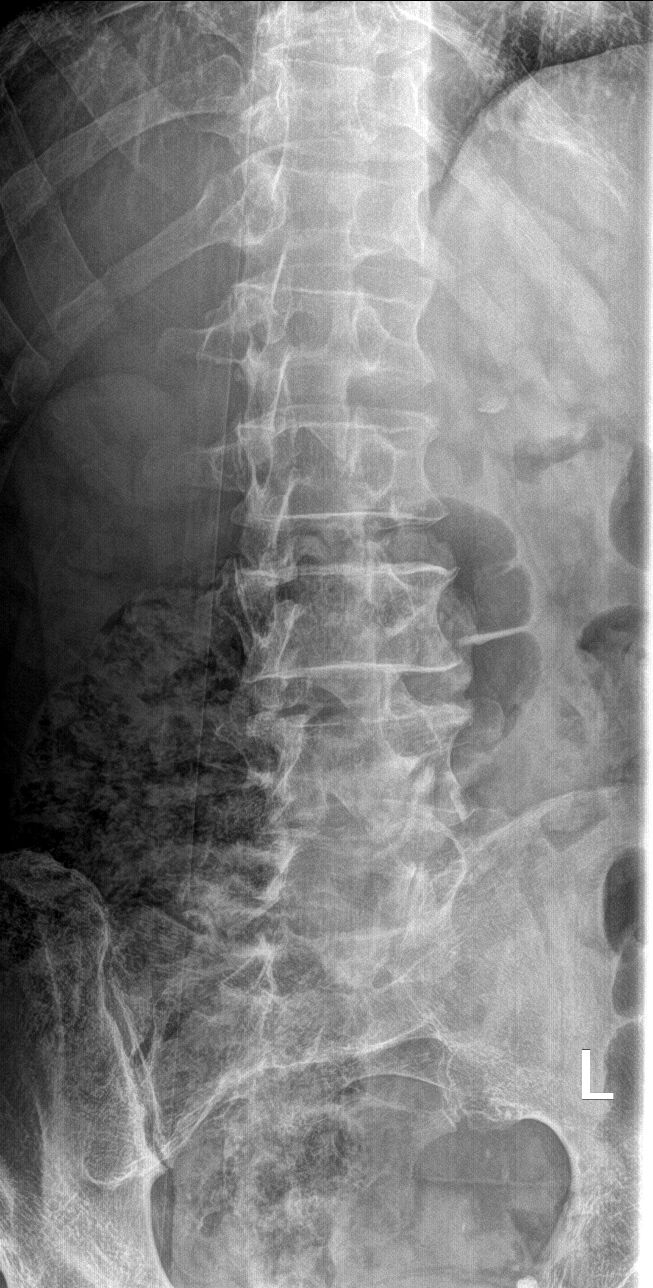

[l-spine lat]
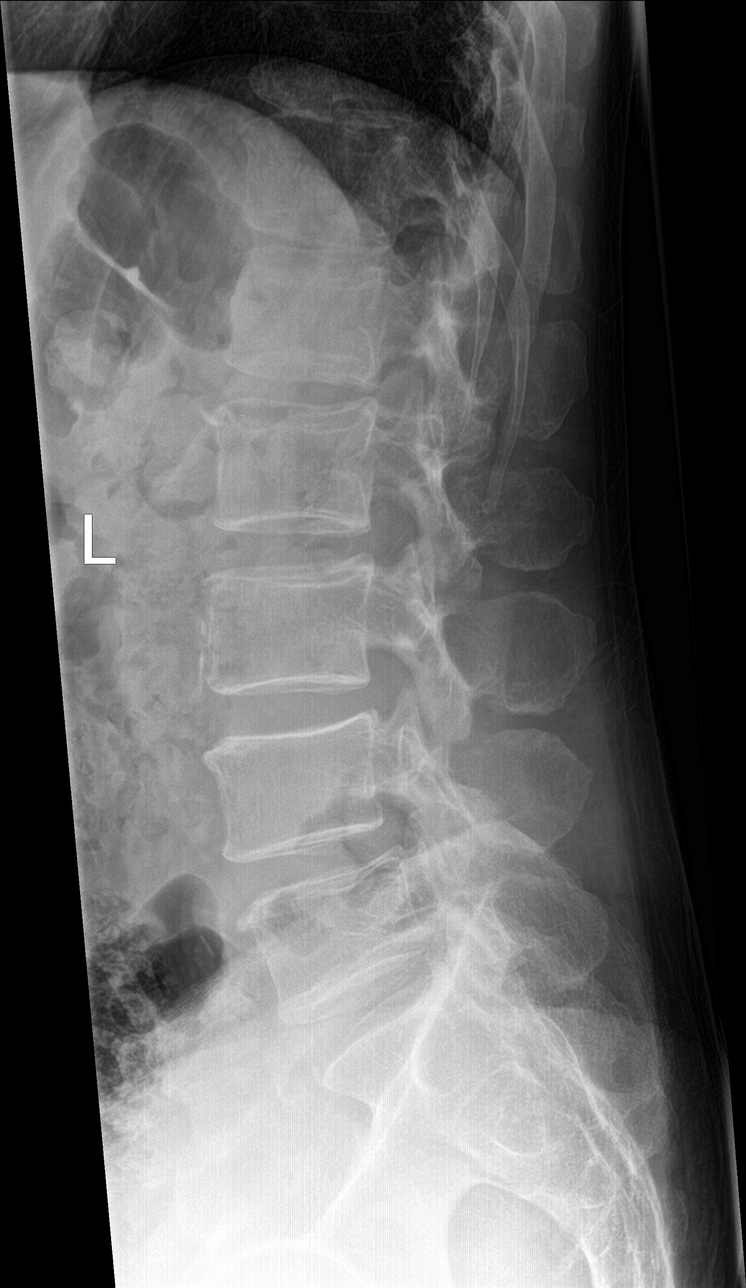

[l-spine spot]
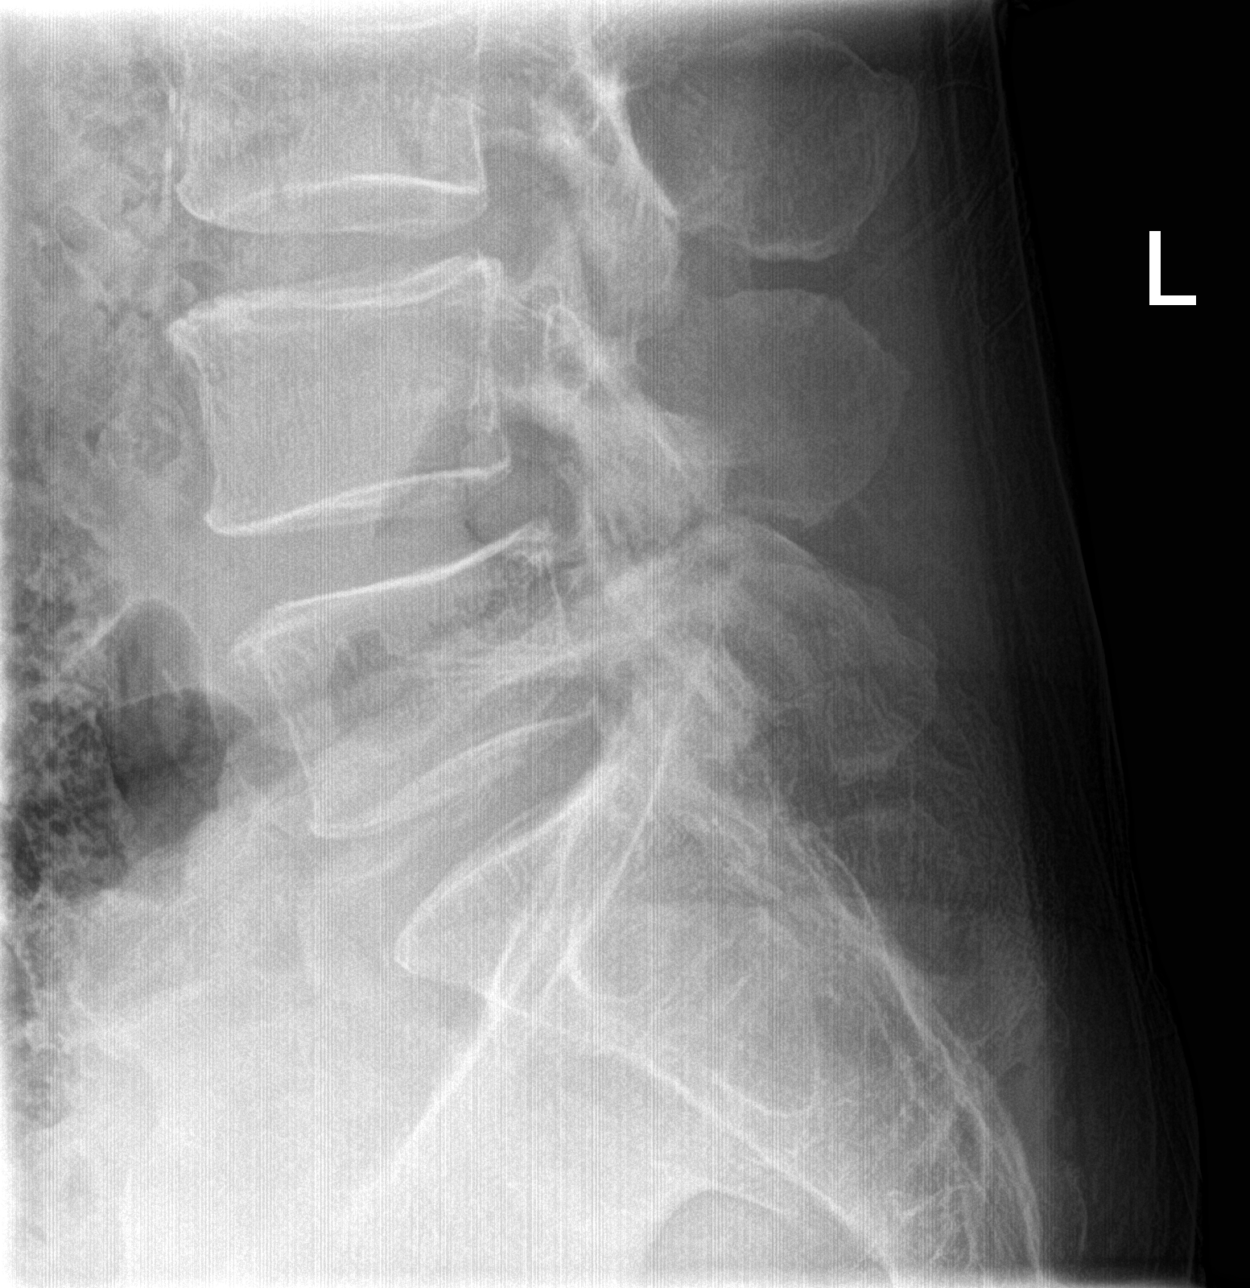

[5 of 5 positions shown; findings below may reference images not displayed]

FINDINGS: There is no evidence of lumbar spine fracture. Alignment is normal.
Intervertebral disc spaces are maintained. Facet DJD L4-5, left
worse than right. Aortic Atherosclerosis (CSLJ8-170.0).
IMPRESSION: 1. Negative for fracture or other acute bone abnormality.
2. Facet DJD L4-5.

## 2019-07-28 NOTE — Telephone Encounter (Signed)
CMA spoke to patient. He think he have a sinus infection, he stated he knows he have it. He stated he was coughing up green mucus but it is clearing up.  Pt. Have an upcoming appt. With another provider on 09.10.  Pt. Requested if he can get Amoxicillin and Prednisone for his sinus infection.

## 2019-08-01 NOTE — Telephone Encounter (Signed)
Will need to be evaluated in the office on the 10th

## 2019-08-01 NOTE — Discharge Summary (Addendum)
Patient seen by psychiatry team. Denies SI, HI and AVH. Current stressors include recent eviction from home and inability to find employment in field. Patient describes "frustrating conversation with my mother, I asked for help and she gave me the number for AA." See detailed discharge summary completed by Dr Mariea Clonts 07/25/2019   Patient's chart reviewed. Reviewed the information documented and agree with the treatment plan.  Buford Dresser, DO 08/01/19 5:14 PM

## 2019-08-04 ENCOUNTER — Other Ambulatory Visit: Payer: Self-pay

## 2019-08-04 ENCOUNTER — Encounter: Payer: Self-pay | Admitting: Internal Medicine

## 2019-08-04 ENCOUNTER — Ambulatory Visit: Payer: Self-pay | Attending: Internal Medicine | Admitting: Internal Medicine

## 2019-08-04 DIAGNOSIS — J4521 Mild intermittent asthma with (acute) exacerbation: Secondary | ICD-10-CM

## 2019-08-04 MED ORDER — ALBUTEROL SULFATE HFA 108 (90 BASE) MCG/ACT IN AERS: 2.0000 | INHALATION_SPRAY | Freq: Four times a day (QID) | RESPIRATORY_TRACT | 1 refills | Status: AC | PRN

## 2019-08-04 MED ORDER — PREDNISONE 20 MG PO TABS: ORAL_TABLET | ORAL | 0 refills | Status: DC

## 2019-08-04 MED ORDER — AMOXICILLIN-POT CLAVULANATE 500-125 MG PO TABS: 1.0000 | ORAL_TABLET | Freq: Two times a day (BID) | ORAL | 0 refills | Status: AC

## 2019-08-04 NOTE — Progress Notes (Signed)
Virtual Visit via Telephone Note  I connected with Dylan Wood on 08/04/19 at 2:55 p.m by telephone and verified that I am speaking with the correct person using two identifiers.   I discussed the limitations, risks, security and privacy concerns of performing an evaluation and management service by telephone and the availability of in person appointments. I also discussed with the patient that there may be a patient responsible charge related to this service. The patient expressed understanding and agreed to proceed.   History of Present Illness: Pt with hx of asthma and MDD  Pt reports he has sinus infection that is moving to his chest.  Cough productive of green phlegm, and worsening SOB, wheezing.  No fever.  Out of Albuterol inhaler x 1.5 wks. Purchase Primatine Mist OTC and have been using 20 times a day. -no sick contacts -Requesting refill on albuterol inhaler.  Also requests some prednisone and Augmentin antibiotic stating that this antibiotics is what usually works for him and has worked in the past.  Outpatient Encounter Medications as of 08/04/2019  Medication Sig  . albuterol (VENTOLIN HFA) 108 (90 Base) MCG/ACT inhaler Inhale 2 puffs into the lungs every 6 (six) hours as needed for wheezing or shortness of breath. (Patient not taking: Reported on 08/04/2019)   No facility-administered encounter medications on file as of 08/04/2019.       Observations/Objective: No direct observation done as this was a telephone encounter.  Assessment and Plan: 1. Mild intermittent asthma with exacerbation I will refill his albuterol inhaler, give a prednisone taper and prescribe some amoxicillin. - albuterol (VENTOLIN HFA) 108 (90 Base) MCG/ACT inhaler; Inhale 2 puffs into the lungs every 6 (six) hours as needed for wheezing or shortness of breath.  Dispense: 8 g; Refill: 1 - predniSONE (DELTASONE) 20 MG tablet; 1.5 tabs PO daily x 3 days then 1 tab x 3 days then 1/2 tab x 3 days   Dispense: 9 tablet; Refill: 0 - amoxicillin-clavulanate (AUGMENTIN) 500-125 MG tablet; Take 1 tablet (500 mg total) by mouth 2 (two) times daily.  Dispense: 14 tablet; Refill: 0   Follow Up Instructions: PRN   I discussed the assessment and treatment plan with the patient. The patient was provided an opportunity to ask questions and all were answered. The patient agreed with the plan and demonstrated an understanding of the instructions.   The patient was advised to call back or seek an in-person evaluation if the symptoms worsen or if the condition fails to improve as anticipated.  I provided 4 minutes of non-face-to-face time during this encounter.   Karle Plumber, MD

## 2019-08-04 NOTE — Progress Notes (Signed)
Pt states his sinus issues is moving into his chest  Pt states when he coughs there is green phlegm   Pt states he already knows what he has and it is bronchitis  Pt states he hasn't taken anything otc   Pt states is requesting amoxicillin, prednisone and a ventolin inhaler

## 2020-02-10 ENCOUNTER — Ambulatory Visit: Payer: Self-pay | Attending: Internal Medicine

## 2020-02-10 DIAGNOSIS — Z23 Encounter for immunization: Secondary | ICD-10-CM

## 2020-02-10 NOTE — Progress Notes (Signed)
   Covid-19 Vaccination Clinic  Name:  Dylan Wood    MRN: [id number] DOB: 11/15/1961  02/10/2020  Mr. [name] was observed post Covid-19 immunization for 15 minutes without incident. He was provided with Vaccine Information Sheet and instruction to access the V-Safe system.   Mr. [name] was instructed to call 911 with any severe reactions post vaccine: Marland Kitchen Difficulty breathing  . Swelling of face and throat  . A fast heartbeat  . A bad rash all over body  . Dizziness and weakness   Immunizations Administered    Name Date Dose VIS Date Route   Pfizer COVID-19 Vaccine 02/10/2020  3:57 PM 0.3 mL 11/04/2019 Intramuscular   Manufacturer: [employer], [employer]   Lot: [id number]   NDC: [address]-9

## 2020-03-02 ENCOUNTER — Ambulatory Visit: Payer: Self-pay

## 2020-03-02 ENCOUNTER — Ambulatory Visit: Payer: Self-pay | Attending: Internal Medicine

## 2020-03-02 DIAGNOSIS — Z23 Encounter for immunization: Secondary | ICD-10-CM

## 2020-03-02 NOTE — Progress Notes (Signed)
   Covid-19 Vaccination Clinic  Name:  Dylan Wood    MRN: [id number] DOB: August 25, 1961  03/02/2020  [name] was observed post Covid-19 immunization for 15 minutes without incident. He was provided with Vaccine Information Sheet and instruction to access the V-Safe system.   [name] was instructed to call 911 with any severe reactions post vaccine: Marland Kitchen Difficulty breathing  . Swelling of face and throat  . A fast heartbeat  . A bad rash all over body  . Dizziness and weakness   Immunizations Administered    Name Date Dose VIS Date Route   Pfizer COVID-19 Vaccine 03/02/2020  5:06 PM 0.3 mL 11/04/2019 Intramuscular   Manufacturer: [employer], [employer]   Lot: [id number]   NDC: [address]-0

## 2024-06-05 ENCOUNTER — Other Ambulatory Visit: Payer: Self-pay

## 2024-06-05 ENCOUNTER — Encounter (HOSPITAL_COMMUNITY): Payer: Self-pay | Admitting: *Deleted

## 2024-06-05 ENCOUNTER — Emergency Department (HOSPITAL_COMMUNITY)
Admission: EM | Admit: 2024-06-05 | Discharge: 2024-06-05 | Discharge status: Against Medical Advice | Attending: Emergency Medicine | Admitting: Emergency Medicine

## 2024-06-05 DIAGNOSIS — Z5321 Procedure and treatment not carried out due to patient leaving prior to being seen by health care provider: Secondary | ICD-10-CM | POA: Insufficient documentation

## 2024-06-05 DIAGNOSIS — J45909 Unspecified asthma, uncomplicated: Secondary | ICD-10-CM | POA: Insufficient documentation

## 2024-06-05 MED ORDER — ALBUTEROL SULFATE HFA 108 (90 BASE) MCG/ACT IN AERS
2.0000 | INHALATION_SPRAY | Freq: Once | RESPIRATORY_TRACT | Status: AC
  Administered 2024-06-05: 2 via RESPIRATORY_TRACT
  Filled 2024-06-05: qty 6.7

## 2024-06-05 NOTE — ED Triage Notes (Signed)
 The pt reports that he is an asthmatic and he lost his inhaler  and he feel tight in the chest

## 2024-06-05 NOTE — ED Notes (Signed)
 The pt reports that he feels better after using the inhaler  and he does not wish to see a doctor   he will call his doctor tomorrow

## 2024-06-05 NOTE — ED Notes (Signed)
 Pt

## 2024-06-14 NOTE — Progress Notes (Signed)
 Subjective Patient ID: Dylan Wood is a 63 y.o. male.  Chief Complaint  Patient presents with  . Fatigue    Patient states he has not been able to sleep for the past three days. Patient also reports he has lost 10lbs In the past week.     The following information was reviewed by members of the visit team:  Tobacco  Allergies  Meds      Fatigue Associated symptoms include fatigue. Pertinent negatives include no abdominal pain, chest pain, chills, congestion, coughing, fever, headaches, myalgias, nausea, neck pain, numbness, rash or vomiting.   Dylan Wood is a 63 y.o. male presenting for evaluation of unintentional weight loss and generalized fatigue. Patient reports having lost about 20 pounds over the past 6 months, with 10 being in the past 1-2 weeks. Denies any other symptoms. Specifically denies nausea, vomiting, diarrhea, constipation, bloody stools, melena,  cough, chest pain, palpitations, headaches, dysuria, hematuria, swelling of extremities. Denies appetite loss, changes in his diet, sleep patterns.  Denies polydipsia, polyuria.  He reports missing work yesterday and requesting a note for yesterday.   Denies family history of any medical problems Denies any prior surgeries  Smokes 5 cigarettes per day.  Review of Systems  Constitutional:  Positive for activity change, fatigue and unexpected weight change. Negative for appetite change, chills and fever.  HENT:  Negative for congestion, rhinorrhea and sinus pressure.   Respiratory:  Negative for cough, shortness of breath and wheezing.   Cardiovascular:  Negative for chest pain, palpitations and leg swelling.  Gastrointestinal:  Negative for abdominal distention, abdominal pain, blood in stool, constipation, diarrhea, nausea and vomiting.  Genitourinary:  Negative for decreased urine volume, difficulty urinating, flank pain, hematuria, scrotal swelling, testicular pain and urgency.  Musculoskeletal:  Negative for  back pain, myalgias and neck pain.  Skin:  Negative for rash and wound.  Neurological:  Negative for dizziness, syncope, light-headedness, numbness and headaches.  Psychiatric/Behavioral:  Negative for confusion. The patient is not hyperactive.     Objective Physical Exam Vitals and nursing note reviewed.  Constitutional:      Appearance: Normal appearance. He is not ill-appearing.  HENT:     Head: Normocephalic and atraumatic.     Ears:     Comments: Hearing normal    Mouth/Throat:     Mouth: Mucous membranes are moist.   Eyes:     Extraocular Movements: Extraocular movements intact.    Cardiovascular:     Rate and Rhythm: Normal rate and regular rhythm.     Pulses: Normal pulses.     Heart sounds: No murmur heard. Pulmonary:     Effort: Pulmonary effort is normal. No respiratory distress.     Breath sounds: Normal breath sounds. No wheezing, rhonchi or rales.  Abdominal:     General: Abdomen is flat. There is no distension.     Palpations: Abdomen is soft. There is no mass.     Tenderness: There is no abdominal tenderness. There is no right CVA tenderness, left CVA tenderness, guarding or rebound.   Musculoskeletal:        General: Normal range of motion.     Cervical back: Normal range of motion and neck supple.     Right lower leg: No edema.     Left lower leg: No edema.   Skin:    General: Skin is warm and dry.     Coloration: Skin is not jaundiced or pale.     Findings: No bruising or rash.  Neurological:     General: No focal deficit present.     Mental Status: He is alert and oriented to person, place, and time.   Psychiatric:        Mood and Affect: Mood normal.        Behavior: Behavior normal.        Thought Content: Thought content normal.        Judgment: Judgment normal.     Assessment/Plan Diagnoses and all orders for this visit:  Weight loss -     Comprehensive Metabolic Panel -     POC Urinalysis Auto without Microscopic -     XR Chest  2 Views -     PSA, Total (Diagnostic) With Reflex to PSA, Free if Indicated; Future  Fatigue, unspecified type -     CBC with Differential -     TSH -     Vitamin B12  Results for orders placed or performed in visit on 06/14/24  POC Urinalysis Auto without Microscopic   Collection Time: 06/14/24  4:47 PM  Result Value Ref Range   Color, Urine Yellow Yellow   Clarity, Urine Clear Clear   Glucose, Urine Negative Negative mg/dL   Bilirubin, Urine Negative Negative   Ketones, Urine Negative Negative mg/dL   Specific Gravity, Urine 1.020 1.010, 1.015, 1.020, 1.025   Blood, Urine Small (A) Negative   pH, Urine 6.0 5.0, 5.5, 6.0, 6.5, 7.0, 7.5, 8.0   Protein, Urine Negative Negative mg/dL   Urobilinogen, Urine 0.2 <2.0 mg/dL   Nitrite, Urine Negative Negative   Leukocyte Esterase, Urine Negative Negative   Kit/Device Lot # [id number]    Kit/Device Expiration Date [id number]     XR Chest 2 Views  Final Result by Karlton Marilou Ferron, MD (07/22 1631)  XR CHEST 2 VIEWS, 06/14/2024 4:24 PM    INDICATION: Abnormal weight loss \ R63.4 Abnormal weight loss   COMPARISON: None    FINDINGS:     Cardiovascular: Cardiac silhouette and pulmonary vasculature are within   normal limits.  Mediastinum: Within normal limits.  Lungs/pleura: Clear.  Upper abdomen: Visualized portions are unremarkable.   Chest wall/osseous structures: Multilevel degenerative changes in the   visualized spine.      IMPRESSION:  There is no evidence of acute cardiac or pulmonary abnormality.       Discussed getting established with a primary care provider.  Needs a colonoscopy. Needs general health physical.   Urgent Care Disposition:  Follow up with PCP  Referral to Family requested    Electronically signed: Bari Stephane Ally, PA-C 06/14/2024  4:56 PM

## 2024-07-05 NOTE — Telephone Encounter (Signed)
 Patient came in today requesting FMLA paperwork to be filled out.  Patient spoke with Deidre Mani and made aware that we do not fill out FMLA paperwork he would need to have his PCP fill out the paperwork. Patient states he did not have a PCP.  Patient advised that he could establish care with primary medicine and then discuss his FMLA needs with them and they may be able to fill out the paperwork.

## 2024-10-24 ENCOUNTER — Encounter (HOSPITAL_COMMUNITY): Payer: Self-pay

## 2024-10-24 ENCOUNTER — Emergency Department (HOSPITAL_COMMUNITY)

## 2024-10-24 ENCOUNTER — Telehealth: Payer: Self-pay

## 2024-10-24 ENCOUNTER — Telehealth (HOSPITAL_COMMUNITY): Payer: Self-pay | Admitting: Emergency Medicine

## 2024-10-24 ENCOUNTER — Emergency Department (HOSPITAL_COMMUNITY)
Admission: EM | Admit: 2024-10-24 | Discharge: 2024-10-24 | Disposition: A | Discharge status: Home / Self Care | Attending: Emergency Medicine | Admitting: Emergency Medicine

## 2024-10-24 ENCOUNTER — Other Ambulatory Visit (HOSPITAL_COMMUNITY): Payer: Self-pay

## 2024-10-24 DIAGNOSIS — J45909 Unspecified asthma, uncomplicated: Secondary | ICD-10-CM | POA: Insufficient documentation

## 2024-10-24 DIAGNOSIS — T07XXXA Unspecified multiple injuries, initial encounter: Secondary | ICD-10-CM

## 2024-10-24 DIAGNOSIS — M25571 Pain in right ankle and joints of right foot: Secondary | ICD-10-CM | POA: Insufficient documentation

## 2024-10-24 DIAGNOSIS — S30810A Abrasion of lower back and pelvis, initial encounter: Secondary | ICD-10-CM | POA: Insufficient documentation

## 2024-10-24 DIAGNOSIS — S61412A Laceration without foreign body of left hand, initial encounter: Secondary | ICD-10-CM | POA: Diagnosis not present

## 2024-10-24 DIAGNOSIS — Y92410 Unspecified street and highway as the place of occurrence of the external cause: Secondary | ICD-10-CM | POA: Insufficient documentation

## 2024-10-24 DIAGNOSIS — Z23 Encounter for immunization: Secondary | ICD-10-CM | POA: Insufficient documentation

## 2024-10-24 DIAGNOSIS — S6992XA Unspecified injury of left wrist, hand and finger(s), initial encounter: Secondary | ICD-10-CM | POA: Diagnosis present

## 2024-10-24 DIAGNOSIS — M542 Cervicalgia: Secondary | ICD-10-CM | POA: Insufficient documentation

## 2024-10-24 DIAGNOSIS — T1490XA Injury, unspecified, initial encounter: Secondary | ICD-10-CM

## 2024-10-24 DIAGNOSIS — M25552 Pain in left hip: Secondary | ICD-10-CM | POA: Insufficient documentation

## 2024-10-24 DIAGNOSIS — T148XXA Other injury of unspecified body region, initial encounter: Secondary | ICD-10-CM

## 2024-10-24 DIAGNOSIS — M25572 Pain in left ankle and joints of left foot: Secondary | ICD-10-CM | POA: Insufficient documentation

## 2024-10-24 DIAGNOSIS — M62838 Other muscle spasm: Secondary | ICD-10-CM

## 2024-10-24 LAB — COMPREHENSIVE METABOLIC PANEL WITH GFR
ALT: 13 U/L (ref 0–44)
AST: 28 U/L (ref 15–41)
Albumin: 3.9 g/dL (ref 3.5–5.0)
Alkaline Phosphatase: 67 U/L (ref 38–126)
Anion gap: 12 (ref 5–15)
BUN: 17 mg/dL (ref 8–23)
CO2: 23 mmol/L (ref 22–32)
Calcium: 8.6 mg/dL — ABNORMAL LOW (ref 8.9–10.3)
Chloride: 102 mmol/L (ref 98–111)
Creatinine, Ser: 0.83 mg/dL (ref 0.61–1.24)
GFR, Estimated: >60 mL/min (ref >60)
Glucose, Bld: 126 mg/dL — ABNORMAL HIGH (ref 70–99)
Potassium: 4.4 mmol/L (ref 3.5–5.1)
Sodium: 137 mmol/L (ref 135–145)
Total Bilirubin: 0.9 mg/dL (ref 0.0–1.2)
Total Protein: 6.1 g/dL — ABNORMAL LOW (ref 6.5–8.1)

## 2024-10-24 LAB — I-STAT CHEM 8, ED
BUN: 20 mg/dL (ref 8–23)
Calcium, Ion: 1 mmol/L — ABNORMAL LOW (ref 1.15–1.40)
Chloride: 104 mmol/L (ref 98–111)
Creatinine, Ser: 0.9 mg/dL (ref 0.61–1.24)
Glucose, Bld: 127 mg/dL — ABNORMAL HIGH (ref 70–99)
HCT: 44 % (ref 39.0–52.0)
Hemoglobin: 15 g/dL (ref 13.0–17.0)
Potassium: 4.4 mmol/L (ref 3.5–5.1)
Sodium: 138 mmol/L (ref 135–145)
TCO2: 25 mmol/L (ref 22–32)

## 2024-10-24 LAB — CBC
HCT: 44.8 % (ref 39.0–52.0)
Hemoglobin: 14.8 g/dL (ref 13.0–17.0)
MCH: 30.1 pg (ref 26.0–34.0)
MCHC: 33 g/dL (ref 30.0–36.0)
MCV: 91.1 fL (ref 80.0–100.0)
Platelets: 343 K/uL (ref 150–400)
RBC: 4.92 MIL/uL (ref 4.22–5.81)
RDW: 13.2 % (ref 11.5–15.5)
WBC: 7.3 K/uL (ref 4.0–10.5)
nRBC: 0 % (ref 0.0–0.2)

## 2024-10-24 LAB — SAMPLE TO BLOOD BANK

## 2024-10-24 LAB — URINALYSIS, ROUTINE W REFLEX MICROSCOPIC
Bilirubin Urine: NEGATIVE
Glucose, UA: NEGATIVE mg/dL
Ketones, ur: NEGATIVE mg/dL
Leukocytes,Ua: NEGATIVE
Nitrite: NEGATIVE
Protein, ur: NEGATIVE mg/dL
Specific Gravity, Urine: <1.005 — ABNORMAL LOW (ref 1.005–1.030)
pH: 6 (ref 5.0–8.0)

## 2024-10-24 LAB — I-STAT CG4 LACTIC ACID, ED: Lactic Acid, Venous: 1.6 mmol/L (ref 0.5–1.9)

## 2024-10-24 LAB — PROTIME-INR
INR: 1 (ref 0.8–1.2)
Prothrombin Time: 13.3 s (ref 11.4–15.2)

## 2024-10-24 LAB — ETHANOL: Alcohol, Ethyl (B): <15 mg/dL (ref <15)

## 2024-10-24 LAB — URINALYSIS, MICROSCOPIC (REFLEX): Bacteria, UA: NONE SEEN

## 2024-10-24 MED ORDER — TETANUS-DIPHTH-ACELL PERTUSSIS 5-2-15.5 LF-MCG/0.5 IM SUSP
0.5000 mL | Freq: Once | INTRAMUSCULAR | Status: AC
  Administered 2024-10-24: 0.5 mL via INTRAMUSCULAR
  Filled 2024-10-24: qty 0.5

## 2024-10-24 MED ORDER — IOHEXOL 350 MG/ML SOLN
75.0000 mL | Freq: Once | INTRAVENOUS | Status: AC | PRN
  Administered 2024-10-24: 75 mL via INTRAVENOUS

## 2024-10-24 MED ORDER — OXYCODONE-ACETAMINOPHEN 5-325 MG PO TABS: 1.0000 | ORAL_TABLET | ORAL | 0 refills | Status: DC | PRN

## 2024-10-24 MED ORDER — CYCLOBENZAPRINE HCL 10 MG PO TABS: 10.0000 mg | ORAL_TABLET | Freq: Two times a day (BID) | ORAL | 0 refills | Status: DC | PRN

## 2024-10-24 MED ORDER — CYCLOBENZAPRINE HCL 10 MG PO TABS: 10.0000 mg | ORAL_TABLET | Freq: Two times a day (BID) | ORAL | 0 refills | Status: AC | PRN

## 2024-10-24 MED ORDER — LIDOCAINE 5 % EX PTCH: 1.0000 | MEDICATED_PATCH | CUTANEOUS | 0 refills | Status: DC

## 2024-10-24 MED ORDER — HYDROMORPHONE HCL 1 MG/ML IJ SOLN
1.0000 mg | Freq: Once | INTRAMUSCULAR | Status: AC
  Administered 2024-10-24: 1 mg via INTRAVENOUS
  Filled 2024-10-24: qty 1

## 2024-10-24 MED ORDER — LIDOCAINE 5 % EX PTCH: 1.0000 | MEDICATED_PATCH | CUTANEOUS | 0 refills | Status: AC

## 2024-10-24 MED ORDER — OXYCODONE-ACETAMINOPHEN 5-325 MG PO TABS: 1.0000 | ORAL_TABLET | ORAL | 0 refills | Status: AC | PRN

## 2024-10-24 NOTE — Telephone Encounter (Signed)
 Received call from patient. [facility] pharmacy did not receive scripts from today's ED visit. MD resent. [facility] is currently filling RXs. Patient updated.   Merilee Batty, MSN, RN Case Management [phone]

## 2024-10-24 NOTE — Progress Notes (Signed)
 Chaplain respond to ED page, Pt was being escorted to CT Scan for further evaluation at the time of arrival. No family was present during the visit
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
## 2024-10-24 NOTE — ED Notes (Signed)
 Dr Tegeler deferred c collar at this time, pt denies neck pain
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
## 2024-10-24 NOTE — ED Notes (Signed)
Pt returned from CT with this RN.  

## 2024-10-24 NOTE — Telephone Encounter (Signed)
 Patient called to report prescriptions initially intended to not at the pharmacy.  Will try to resend.

## 2024-10-26 ENCOUNTER — Telehealth: Payer: Self-pay

## 2024-10-26 ENCOUNTER — Ambulatory Visit: Payer: Self-pay
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
## 2024-10-26 NOTE — Telephone Encounter (Signed)
 Received call from patient expressing his desire to have his FMLA paperwork completed. Patient has a pcp appointment scheduled in January. CM encourage patient to contact alternate pcp clinics for a possible earlier appointment. Also encouraged him to contact his insurance's member services number for additional assistance if needed.   Merilee Batty, MSN, RN Case Management [phone]

## 2024-10-28 ENCOUNTER — Telehealth: Payer: Self-pay | Admitting: Family

## 2024-10-28 ENCOUNTER — Encounter: Payer: Self-pay | Admitting: Family

## 2024-10-28 NOTE — Telephone Encounter (Signed)
 Called patient about missed appt on 12/5. Left voicemail and stated that patient with have to be est pt for 90 days before provider fill out fmla paperwork.

## 2024-10-28 NOTE — Progress Notes (Signed)
 Erroneous encounter-disregard

## 2024-10-31 ENCOUNTER — Telehealth (HOSPITAL_BASED_OUTPATIENT_CLINIC_OR_DEPARTMENT_OTHER): Payer: Self-pay | Admitting: *Deleted

## 2024-10-31 NOTE — Telephone Encounter (Signed)
 Pt will be a new pt of [facility]. Upcoming appt scheduled 12/17. Routing this encounter to Lauraine for review.

## 2024-10-31 NOTE — Telephone Encounter (Signed)
 Copied from CRM [id number]. Topic: General - Other >> Oct 31, 2024 12:18 PM Hadassah PARAS wrote: Reason for CRM: Pt originally had an app scheduled w [facility] in which he missed. He was also advised that he would need to be an established pt for 3months in order to have FMLA paperwork filled out. Pt is upset and needs paperwork filled out ASAP. App scheduled with [facility] - Lauraine FORBES Angus, FNP. Please confirm if pt needs to be a pt for a certain amount of time before paperwork can be completed. Advise pt on #[phone]

## 2024-11-07 ENCOUNTER — Other Ambulatory Visit: Payer: Self-pay

## 2024-11-08 NOTE — Telephone Encounter (Signed)
 Pt has appt scheduled tomorrow 12/17 to get established with the practice.

## 2024-11-09 ENCOUNTER — Encounter (HOSPITAL_BASED_OUTPATIENT_CLINIC_OR_DEPARTMENT_OTHER): Payer: Self-pay

## 2024-11-09 ENCOUNTER — Ambulatory Visit (HOSPITAL_BASED_OUTPATIENT_CLINIC_OR_DEPARTMENT_OTHER)

## 2024-11-29 DIAGNOSIS — M5440 Lumbago with sciatica, unspecified side: Secondary | ICD-10-CM

## 2024-11-29 DIAGNOSIS — M545 Low back pain, unspecified: Secondary | ICD-10-CM

## 2024-12-22 ENCOUNTER — Emergency Department (HOSPITAL_COMMUNITY)
Admission: EM | Admit: 2024-12-22 | Discharge: 2024-12-22 | Disposition: A | Discharge status: Home / Self Care | Attending: Emergency Medicine | Admitting: Emergency Medicine

## 2024-12-22 ENCOUNTER — Other Ambulatory Visit: Payer: Self-pay

## 2024-12-22 ENCOUNTER — Encounter (HOSPITAL_COMMUNITY): Payer: Self-pay

## 2024-12-22 ENCOUNTER — Emergency Department (HOSPITAL_COMMUNITY)

## 2024-12-22 DIAGNOSIS — R059 Cough, unspecified: Secondary | ICD-10-CM | POA: Diagnosis present

## 2024-12-22 DIAGNOSIS — F17200 Nicotine dependence, unspecified, uncomplicated: Secondary | ICD-10-CM | POA: Diagnosis not present

## 2024-12-22 DIAGNOSIS — J101 Influenza due to other identified influenza virus with other respiratory manifestations: Secondary | ICD-10-CM | POA: Insufficient documentation

## 2024-12-22 LAB — RESP PANEL BY RT-PCR (RSV, FLU A&B, COVID)  RVPGX2
Influenza A by PCR: POSITIVE — AB
Influenza B by PCR: NEGATIVE
Resp Syncytial Virus by PCR: NEGATIVE
SARS Coronavirus 2 by RT PCR: NEGATIVE

## 2024-12-22 MED ORDER — OSELTAMIVIR PHOSPHATE 75 MG PO CAPS: 75.0000 mg | ORAL_CAPSULE | Freq: Two times a day (BID) | ORAL | 0 refills | Status: AC

## 2024-12-22 MED ORDER — AEROCHAMBER PLUS FLO-VU LARGE MISC: 1.0000 | Freq: Once | Status: DC

## 2024-12-22 MED ORDER — IPRATROPIUM-ALBUTEROL 0.5-2.5 (3) MG/3ML IN SOLN
3.0000 mL | Freq: Once | RESPIRATORY_TRACT | Status: AC
  Administered 2024-12-22: 3 mL via RESPIRATORY_TRACT
  Filled 2024-12-22: qty 3

## 2024-12-22 MED ORDER — ALBUTEROL SULFATE HFA 108 (90 BASE) MCG/ACT IN AERS
2.0000 | INHALATION_SPRAY | Freq: Once | RESPIRATORY_TRACT | Status: AC
  Administered 2024-12-22: 2 via RESPIRATORY_TRACT
  Filled 2024-12-22: qty 6.7

## 2024-12-22 MED ORDER — PREDNISONE 20 MG PO TABS: 40.0000 mg | ORAL_TABLET | Freq: Every day | ORAL | 0 refills | Status: AC

## 2024-12-22 NOTE — ED Triage Notes (Signed)
 Pt BIB [facility] from home for SOB. Pt reports hx of asthma & ran out of his inhaler a few days ago. EMS gave 1 duoneb, Atrovent, & 125mg  Solumedrol via 18g in RAC.  VSS   Pt reports fatigue, chest tightness with dry cough & states it feels like it did when he had pneumonia. Denies any other s/s at time of triage.

## 2024-12-22 NOTE — Discharge Instructions (Addendum)
 You may take over-the-counter medicine for symptomatic relief, such as Tylenol, Motrin, TheraFlu, Alka seltzer , black elderberry, etc. Please limit acetaminophen (Tylenol) to 4000 mg and Ibuprofen (Motrin, Advil, etc.) to 2400 mg for a 24hr period. Please note that other over-the-counter medicine may contain acetaminophen or ibuprofen as a component of their ingredients.

## 2024-12-22 NOTE — ED Provider Notes (Signed)
 " [place] EMERGENC[facility] DEPARTMENT AT [facility] Provider Note  CSN: [id number] Arrival date & time: 12/22/24 0122  Chief Complaint(s) Shortness of Breath  History provided by patient. HPI & MDM Dylan Wood is a 64 y.o. male .   Shortness of Breath Severity:  Moderate Onset quality:  Gradual Duration:  3 days Timing:  Constant Progression:  Waxing and waning Chronicity:  Recurrent Relieved by:  Inhaler Worsened by:  Coughing and activity Associated symptoms: cough and wheezing   Associated symptoms: no chest pain, no fever and no sputum production   Risk factors: tobacco use    Reports that he has been using his inhaler frequently over the past 3 days. Feels like he has pneumonia or bronchitis.  Patient was brought in by EMS and received 1 DuoNeb, and given Solu-Medrol. He does report feeling better after the breathing treatment but still feels wheezy.    Medical Decision Making Amount and/or Complexity of Data Reviewed Labs: ordered. Decision-making details documented in ED Course. Radiology: ordered and independent interpretation performed. Decision-making details documented in ED Course.  Risk Prescription drug management.     The patient's presentation involves an extensive number of treatment options, and the complaint(s) carry with it a high risk of complications and morbidity. The differential diagnosis includes but not limited to those listed below:  SOB Exam is suspicious for asthma/COPD exacerbation.  Will obtain a chest x-ray to rule out pneumonia but favor bronchitis over pneumonia.  Will also assess for COVID, influenza, RSV. Patient given additional DuoNeb. Chest x-ray negative for pneumonia, pneumothorax, pulmonary edema or pleural effusions.  It is consistent with COPD. Viral panel positive for influenza A. Given history, Tamiflu  offered. WOB is good. Satting well on RA. Safe for DC.    Final Clinical Impression(s) / ED  Diagnoses Final diagnoses:  Influenza A   The patient appears reasonably screened and/or stabilized for discharge and I doubt any other medical condition or other [facility] requiring further screening, evaluation, or treatment in the ED at this time. I have discussed the findings, Dx and Tx plan with the patient/family who expressed understanding and agree(s) with the plan. Discharge instructions discussed at length. The patient/family was given strict return precautions who verbalized understanding of the instructions. No further questions at time of discharge.  Disposition: Discharge  Condition: Good  ED Discharge Orders          Ordered    predniSONE  (DELTASONE ) 20 MG tablet  Daily with breakfast        12/22/24 0400    oseltamivir  (TAMIFLU ) 75 MG capsule  Every 12 hours        12/22/24 0400              Follow Up: Primary care provider  Call  to schedule an appointment for close follow up     Past Medical History Past Medical History:  Diagnosis Date   Asthma    Shingles    Patient Active Problem List   Diagnosis Date Noted   MDD (major depressive disorder) 07/24/2019   Mild intermittent asthma 02/10/2019   Home Medication(s) Prior to Admission medications  Medication Sig Start Date End Date Taking? Authorizing Provider  oseltamivir  (TAMIFLU ) 75 MG capsule Take 1 capsule (75 mg total) by mouth every 12 (twelve) hours. 12/22/24  Yes Khyron Garno, Raynell Moder, MD  predniSONE  (DELTASONE ) 20 MG tablet Take 2 tablets (40 mg total) by mouth daily with breakfast for 4 days. 12/22/24 12/26/24 Yes Reagen Haberman, Raynell Moder,  MD  albuterol  (VENTOLIN  HFA) 108 (90 Base) MCG/ACT inhaler Inhale 2 puffs into the lungs every 6 (six) hours as needed for wheezing or shortness of breath. 08/04/19   Vicci Barnie NOVAK, MD  amoxicillin -clavulanate (AUGMENTIN ) 500-125 MG tablet Take 1 tablet (500 mg total) by mouth 2 (two) times daily. 08/04/19   Vicci Barnie NOVAK, MD  cyclobenzaprine  (FLEXERIL ) 10  MG tablet Take 1 tablet (10 mg total) by mouth 2 (two) times daily as needed for muscle spasms. 10/24/24   Tegeler, Lonni PARAS, MD  lidocaine  (LIDODERM ) 5 % Place 1 patch onto the skin daily. Remove & Discard patch within 12 hours or as directed by MD 10/24/24   Tegeler, Lonni PARAS, MD  oxyCODONE -acetaminophen  (PERCOCET/ROXICET) 5-325 MG tablet Take 1 tablet by mouth every 4 (four) hours as needed for severe pain (pain score 7-10). 10/24/24   Tegeler, Lonni PARAS, MD                                                                                                                                    Allergies Patient has no known allergies.  Review of Systems Review of Systems  Constitutional:  Negative for fever.  Respiratory:  Positive for cough, shortness of breath and wheezing. Negative for sputum production.   Cardiovascular:  Negative for chest pain.   As noted in HPI  Physical Exam Vital Signs  I have reviewed the triage vital signs BP (!) 156/81 (BP Location: Right Arm)   Pulse 80   Temp 97.7 F (36.5 C) (Oral)   Resp 18   Ht 6' 1 (1.854 m)   Wt 81.6 kg   SpO2 100%   BMI 23.75 kg/m   Physical Exam Vitals reviewed.  Constitutional:      General: He is not in acute distress.    Appearance: He is well-developed. He is not diaphoretic.  HENT:     Head: Normocephalic and atraumatic.     Nose: Nose normal.  Eyes:     General: No scleral icterus.       Right eye: No discharge.        Left eye: No discharge.     Conjunctiva/sclera: Conjunctivae normal.     Pupils: Pupils are equal, round, and reactive to light.  Cardiovascular:     Rate and Rhythm: Normal rate and regular rhythm.     Heart sounds: No murmur heard.    No friction rub. No gallop.  Pulmonary:     Effort: Pulmonary effort is normal. No respiratory distress.     Breath sounds: No stridor or decreased air movement. Examination of the right-upper field reveals rhonchi. Examination of the left-upper field  reveals rhonchi. Examination of the right-middle field reveals rhonchi. Examination of the left-middle field reveals rhonchi. Examination of the right-lower field reveals rhonchi. Examination of the left-lower field reveals rhonchi. Rhonchi present. No rales.  Abdominal:     General: There is  no distension.     Palpations: Abdomen is soft.     Tenderness: There is no abdominal tenderness.  Musculoskeletal:        General: No tenderness.     Cervical back: Normal range of motion and neck supple.  Skin:    General: Skin is warm and dry.     Findings: No erythema or rash.  Neurological:     Mental Status: He is alert and oriented to person, place, and time.     ED Results and Treatments Labs (all labs ordered are listed, but only abnormal results are displayed) Labs Reviewed  RESP PANEL BY RT-PCR (RSV, FLU A&B, COVID)  [id number] - Abnormal; Notable for the following components:      Result Value   Influenza A by PCR POSITIVE (*)    All other components within normal limits                                                                                                                         EKG  EKG Interpretation Date/Time:    Ventricular Rate:    PR Interval:    QRS Duration:    QT Interval:    QTC Calculation:   R Axis:      Text Interpretation:         Radiology DG Chest 2 View Result Date: 12/22/2024 EXAM: 2 VIEW(S) XRAY OF THE CHEST 12/22/2024 02:42:00 AM COMPARISON: 10/24/2024 CLINICAL HISTORY: Cough. FINDINGS: LUNGS AND PLEURA: The lungs are symmetrically hyperinflated in keeping with changes of underlying COPD. No focal pulmonary opacity. No pleural effusion. No pneumothorax. HEART AND MEDIASTINUM: No acute abnormality of the cardiac and mediastinal silhouettes. BONES AND SOFT TISSUES: No acute osseous abnormality. IMPRESSION: 1. Symmetrically hyperinflated lungs, consistent with COPD. Electronically signed by: Dorethia Molt MD 12/22/2024 03:13 AM EST RP Workstation:  [id number]    Medications Ordered in ED Medications  AeroChamber Plus Flo-Vu Large MISC 1 each (has no administration in time range)  albuterol  (VENTOLIN  HFA) 108 (90 Base) MCG/ACT inhaler 2 puff (has no administration in time range)  ipratropium-albuterol  (DUONEB) 0.5-2.5 (3) MG/3ML nebulizer solution 3 mL (3 mLs Nebulization Given 12/22/24 0228)   Procedures Procedures  (including critical care time)   This chart was dictated using voice recognition software.  Despite best efforts to proofread,  errors can occur which can change the documentation meaning.   Trine Raynell Moder, MD 12/22/24 [phone]  "

## 2024-12-27 ENCOUNTER — Other Ambulatory Visit
